# Patient Record
Sex: Female | Born: 1950 | Race: Black or African American | Hispanic: No | Smoking: Former smoker
Health system: Southern US, Community
[De-identification: ages and names within clinical notes are randomized; demographics above are authoritative.]

## PROBLEM LIST (undated history)

## (undated) DIAGNOSIS — I10 Essential (primary) hypertension: Secondary | ICD-10-CM

## (undated) HISTORY — DX: Essential (primary) hypertension: I10

## (undated) HISTORY — PX: TUBAL LIGATION: SHX77

---

## 2020-03-25 ENCOUNTER — Ambulatory Visit
Admission: EM | Admit: 2020-03-25 | Discharge: 2020-03-25 | Disposition: A | Discharge status: Home / Self Care | Payer: Medicare HMO

## 2020-03-25 ENCOUNTER — Encounter: Payer: Self-pay | Admitting: Physician Assistant

## 2020-03-25 DIAGNOSIS — I10 Essential (primary) hypertension: Secondary | ICD-10-CM

## 2020-03-25 MED ORDER — AMLODIPINE BESYLATE 5 MG PO TABS: 5.0000 mg | ORAL_TABLET | Freq: Every day | ORAL | 2 refills | Status: DC

## 2020-03-25 NOTE — Discharge Instructions (Addendum)
Start amlodipine as directed. Keep blood pressure log daily twice a day for PCP to see. Follow up with PCP for further evaluation and management needed. If blood pressure >190, having chest pain, shortness of breath, headache/blurry vision, dizziness, one sided weakness, go to the emergency department for further evaluation needed.

## 2020-03-25 NOTE — ED Triage Notes (Signed)
Pt states she went to the dentist and was advised her blood pressure was elevated. Pt denies any previous hx of hypertension or any other significant medical history. Pt is asymptomatic, aox4, and ambulatory.

## 2020-03-25 NOTE — ED Notes (Signed)
No answer x3 by phone and lobby

## 2020-03-25 NOTE — ED Provider Notes (Signed)
[facility]    CSN: [id number] Arrival date & time: 03/25/20  0912      History   Chief Complaint No chief complaint on file.   HPI Tracy Briggs is a 69 y.o. female.   68 year old female comes in for high blood pressure reading.  She has no significant past medical history, but has not followed with PCP for many years.  States was following up with dentist for dental work, and was told she was hypertensive in the 200s systolic, and was told to come in for evaluation.  She is asymptomatic, denies chest pain, shortness of breath, headache, blurry vision.  Denies weakness, dizziness, syncope.  Denies any pain. No history of HTN.      History reviewed. No pertinent past medical history.  There are no problems to display for this patient.   Past Surgical History:  Procedure Laterality Date  . TUBAL LIGATION      OB History   No obstetric history on file.      Home Medications    Prior to Admission medications   Medication Sig Start Date End Date Taking? Authorizing Provider  Multiple Vitamin (MULTIVITAMIN) tablet Take 1 tablet by mouth daily.   Yes [provider]  amLODipine (NORVASC) 5 MG tablet Take 1 tablet (5 mg total) by mouth daily. 03/25/20   Belinda Fisher, PA-C    Family History History reviewed. No pertinent family history.  Social History Social History   Tobacco Use  . Smoking status: Former Games developer  . Smokeless tobacco: Never Used  Substance Use Topics  . Alcohol use: Yes    Comment: rare  . Drug use: Not on file     Allergies   Codeine   Review of Systems Review of Systems  Reason unable to perform ROS: See HPI as above.     Physical Exam Triage Vital Signs ED Triage Vitals [03/25/20 1210]  Enc Vitals Group     BP (!) 221/108     Pulse Rate 86     Resp 18     Temp 98.1 F (36.7 C)     Temp Source Oral     SpO2 97 %     Weight      Height      Head Circumference      Peak Flow      Pain Score      Pain  Loc      Pain Edu?      Excl. in GC?    No data found.  Updated Vital Signs BP (!) 221/108 (BP Location: Left Arm)   Pulse 86   Temp 98.1 F (36.7 C) (Oral)   Resp 18   SpO2 97%   Physical Exam Constitutional:      General: She is not in acute distress.    Appearance: Normal appearance. She is well-developed. She is not toxic-appearing or diaphoretic.  HENT:     Head: Normocephalic and atraumatic.  Eyes:     Conjunctiva/sclera: Conjunctivae normal.     Pupils: Pupils are equal, round, and reactive to light.  Cardiovascular:     Rate and Rhythm: Normal rate and regular rhythm.  Pulmonary:     Effort: Pulmonary effort is normal. No respiratory distress.     Comments: LCTAB Musculoskeletal:     Cervical back: Normal range of motion and neck supple.  Skin:    General: Skin is warm and dry.  Neurological:     Mental Status: She is  alert and oriented to person, place, and time.     Comments: Grossly intact without focal deficits. Strength 5/5. Sensation intact. Normal gait.       UC Treatments / Results  Labs (all labs ordered are listed, but only abnormal results are displayed) Labs Reviewed - No data to display  EKG   Radiology No results found.  Procedures Procedures (including critical care time)  Medications Ordered in UC Medications - No data to display  Initial Impression / Assessment and Plan / UC Course  I have reviewed the triage vital signs and the nursing notes.  Pertinent labs & imaging results that were available during my care of the patient were reviewed by me and considered in my medical decision making (see chart for details).    Patient at triage BP 221/108, she is asymptomatic without chest pain, shortness of breath, headache, vision changes, dizziness.  Neurology exam grossly intact.  Lungs clear to auscultation bilaterally without adventitious lung sounds.  Start amlodipine as directed.  Patient to keep blood pressure log.  Follow-up with  PCP for further evaluation and management needed.  Return precautions given.  Patient expresses understanding and agrees to plan.  Final Clinical Impressions(s) / UC Diagnoses   Final diagnoses:  Hypertension, unspecified type    ED Prescriptions    Medication Sig Dispense Auth. Provider   amLODipine (NORVASC) 5 MG tablet Take 1 tablet (5 mg total) by mouth daily. 30 tablet Belinda Fisher, PA-C     PDMP not reviewed this encounter.   Belinda Fisher, PA-C 03/25/20 1406

## 2020-04-29 ENCOUNTER — Other Ambulatory Visit: Payer: Self-pay | Admitting: Family Medicine

## 2020-04-29 DIAGNOSIS — Z1231 Encounter for screening mammogram for malignant neoplasm of breast: Secondary | ICD-10-CM

## 2020-05-27 ENCOUNTER — Other Ambulatory Visit: Payer: Self-pay

## 2020-05-27 ENCOUNTER — Ambulatory Visit
Admission: RE | Admit: 2020-05-27 | Discharge: 2020-05-27 | Disposition: A | Discharge status: Home / Self Care | Payer: Medicare HMO | Source: Ambulatory Visit | Attending: Family Medicine | Admitting: Family Medicine

## 2020-05-27 DIAGNOSIS — Z1231 Encounter for screening mammogram for malignant neoplasm of breast: Secondary | ICD-10-CM

## 2020-06-10 ENCOUNTER — Ambulatory Visit (INDEPENDENT_AMBULATORY_CARE_PROVIDER_SITE_OTHER): Payer: Medicare HMO | Admitting: Primary Care

## 2020-06-26 ENCOUNTER — Ambulatory Visit (INDEPENDENT_AMBULATORY_CARE_PROVIDER_SITE_OTHER): Payer: Medicare HMO | Admitting: Physician Assistant

## 2020-06-26 ENCOUNTER — Encounter: Payer: Self-pay | Admitting: Physician Assistant

## 2020-06-26 ENCOUNTER — Other Ambulatory Visit: Payer: Self-pay

## 2020-06-26 VITALS — BP 132/72 | HR 85 | Temp 98.2°F | Resp 18 | Ht 67.0 in | Wt 214.2 lb

## 2020-06-26 DIAGNOSIS — I10 Essential (primary) hypertension: Secondary | ICD-10-CM

## 2020-06-26 MED ORDER — AMLODIPINE BESYLATE 5 MG PO TABS: 5.0000 mg | ORAL_TABLET | Freq: Every day | ORAL | 2 refills | Status: DC

## 2020-06-26 NOTE — Progress Notes (Signed)
Tracy Briggs is a 69 y.o. female here for a new problem.  History of Present Illness:   Chief Complaint  Patient presents with  . New Patient (Initial Visit)    Concerns about her blood pressure. Recent visit to the dentist is when her blood pressure was elevated. Patient didn't fill symptomatic. She's concerned about the current dose of her blood pressure medication. Fasting labs    HPI   HTN Went to the dentist in September and had her BP checked, number was around 200/100. She went to [facility] and was started on Amlodopine 5  mg.   At home blood pressure readings are: extremely variable. Patient denies chest pain, SOB, blurred vision, dizziness, unusual headaches, lower leg swelling. Patient is compliant with medication -- however she did run out of medication about 4 days ago. Denies excessive caffeine intake, stimulant usage, excessive alcohol intake, or increase in salt consumption.  BP Readings from Last 3 Encounters:  06/26/20 132/72  03/25/20 (!) 186/92     Past Medical History:  Diagnosis Date  . Hypertension      Social History   Tobacco Use  . Smoking status: Former Smoker    Types: Cigarettes    Start date: 06/26/1970    Quit date: 06/27/1971    Years since quitting: 49.0  . Smokeless tobacco: Never Used  Substance Use Topics  . Alcohol use: Not Currently    Past Surgical History:  Procedure Laterality Date  . TUBAL LIGATION      Family History  Problem Relation Age of Onset  . Hypertension Mother   . Hypertension Father   . Breast cancer Neg Hx     Allergies  Allergen Reactions  . Codeine Hives    Current Medications:   Current Outpatient Medications:  Marland Kitchen  Multiple Vitamin (MULTIVITAMIN) tablet, Take 1 tablet by mouth daily., Disp: , Rfl:  .  amLODipine (NORVASC) 5 MG tablet, Take 1 tablet (5 mg total) by mouth daily., Disp: 90 tablet, Rfl: 2   Review of Systems:   ROS Negative unless otherwise specified per HPI.  Vitals:    Vitals:   06/26/20 1031  BP: 132/72  Pulse: 85  Resp: 18  Temp: 98.2 F (36.8 C)  TempSrc: Temporal  SpO2: 98%  Weight: 214 lb 3.2 oz (97.2 kg)  Height: 5\' 7"  (1.702 m)     Body mass index is 33.55 kg/m.  Physical Exam:   Physical Exam Vitals and nursing note reviewed.  Constitutional:      General: She is not in acute distress.    Appearance: She is well-developed. She is not ill-appearing, toxic-appearing or sickly-appearing.  Cardiovascular:     Rate and Rhythm: Normal rate and regular rhythm.     Pulses: Normal pulses.     Heart sounds: Normal heart sounds, S1 normal and S2 normal.     Comments: No LE edema Pulmonary:     Effort: Pulmonary effort is normal.     Breath sounds: Normal breath sounds.  Skin:    General: Skin is warm, dry and intact.  Neurological:     Mental Status: She is alert.     GCS: GCS eye subscore is 4. GCS verbal subscore is 5. GCS motor subscore is 6.  Psychiatric:        Mood and Affect: Mood and affect normal.        Speech: Speech normal.        Behavior: Behavior normal. Behavior is cooperative.  No results found for this or any previous visit.  Assessment and Plan:   Loralye was seen today for new patient (initial visit).  Diagnoses and all orders for this visit:  Primary hypertension Normotensive in office today. Restart Norvasc 5 mg daily. Update blood work. Bring BP cuff at next visit to compare. Follow-up in 6 months, sooner if concerns or blood work is abnormal. -     CBC with Differential/Platelet; Future -     Comprehensive metabolic panel; Future -     TSH; Future -     Lipid panel; Future  Other orders -     amLODipine (NORVASC) 5 MG tablet; Take 1 tablet (5 mg total) by mouth daily.  Jarold Motto, PA-C

## 2020-06-26 NOTE — Patient Instructions (Signed)
It was great to see you!  Restart Amlodopine (Norvasc) 5 mg daily.  Update blood work today.  Bring your blood pressure cuff with you next time you come.  Let's follow-up in 6 months, sooner if you have concerns.  Take care,  Jarold Motto PA-C

## 2020-06-27 LAB — CBC WITH DIFFERENTIAL/PLATELET
Absolute Monocytes: 419 cells/uL (ref 200–950)
Basophils Absolute: 41 cells/uL (ref 0–200)
Basophils Relative: 0.7 %
Eosinophils Absolute: 118 cells/uL (ref 15–500)
Eosinophils Relative: 2 %
HCT: 41.7 % (ref 35.0–45.0)
Hemoglobin: 13.8 g/dL (ref 11.7–15.5)
Lymphs Abs: 2466 cells/uL (ref 850–3900)
MCH: 28.2 pg (ref 27.0–33.0)
MCHC: 33.1 g/dL (ref 32.0–36.0)
MCV: 85.1 fL (ref 80.0–100.0)
MPV: 9.6 fL (ref 7.5–12.5)
Monocytes Relative: 7.1 %
Neutro Abs: 2856 cells/uL (ref 1500–7800)
Neutrophils Relative %: 48.4 %
Platelets: 349 10*3/uL (ref 140–400)
RBC: 4.9 10*6/uL (ref 3.80–5.10)
RDW: 13.6 % (ref 11.0–15.0)
Total Lymphocyte: 41.8 %
WBC: 5.9 10*3/uL (ref 3.8–10.8)

## 2020-06-27 LAB — COMPREHENSIVE METABOLIC PANEL
AG Ratio: 1.1 (calc) (ref 1.0–2.5)
ALT: 12 U/L (ref 6–29)
AST: 17 U/L (ref 10–35)
Albumin: 4.1 g/dL (ref 3.6–5.1)
Alkaline phosphatase (APISO): 87 U/L (ref 37–153)
BUN: 8 mg/dL (ref 7–25)
CO2: 29 mmol/L (ref 20–32)
Calcium: 9.6 mg/dL (ref 8.6–10.4)
Chloride: 104 mmol/L (ref 98–110)
Creat: 0.74 mg/dL (ref 0.60–0.93)
Globulin: 3.6 g/dL (calc) (ref 1.9–3.7)
Glucose, Bld: 91 mg/dL (ref 65–99)
Potassium: 4.3 mmol/L (ref 3.5–5.3)
Sodium: 140 mmol/L (ref 135–146)
Total Bilirubin: 0.4 mg/dL (ref 0.2–1.2)
Total Protein: 7.7 g/dL (ref 6.1–8.1)

## 2020-06-27 LAB — LIPID PANEL
Cholesterol: 188 mg/dL (ref <200)
HDL: 48 mg/dL — ABNORMAL LOW (ref >=50)
LDL Cholesterol (Calc): 115 mg/dL (calc) — ABNORMAL HIGH
Non-HDL Cholesterol (Calc): 140 mg/dL (calc) — ABNORMAL HIGH (ref <130)
Total CHOL/HDL Ratio: 3.9 (calc) (ref <5.0)
Triglycerides: 136 mg/dL (ref <150)

## 2020-06-27 LAB — TSH: TSH: 2.98 mIU/L (ref 0.40–4.50)

## 2020-06-28 ENCOUNTER — Other Ambulatory Visit: Payer: Self-pay | Admitting: Physician Assistant

## 2020-07-22 ENCOUNTER — Other Ambulatory Visit: Payer: Self-pay | Admitting: *Deleted

## 2020-07-22 MED ORDER — AMLODIPINE BESYLATE 5 MG PO TABS: 5.0000 mg | ORAL_TABLET | Freq: Every day | ORAL | 0 refills | Status: DC

## 2020-08-13 ENCOUNTER — Other Ambulatory Visit: Payer: Self-pay

## 2020-08-13 ENCOUNTER — Telehealth: Payer: Self-pay

## 2020-08-13 ENCOUNTER — Ambulatory Visit
Admission: EM | Admit: 2020-08-13 | Discharge: 2020-08-13 | Disposition: A | Discharge status: Home / Self Care | Payer: Medicare HMO | Attending: Urgent Care | Admitting: Urgent Care

## 2020-08-13 DIAGNOSIS — I10 Essential (primary) hypertension: Secondary | ICD-10-CM

## 2020-08-13 DIAGNOSIS — R030 Elevated blood-pressure reading, without diagnosis of hypertension: Secondary | ICD-10-CM

## 2020-08-13 MED ORDER — AMLODIPINE BESYLATE 10 MG PO TABS: 10.0000 mg | ORAL_TABLET | Freq: Every day | ORAL | 0 refills | Status: DC

## 2020-08-13 NOTE — ED Provider Notes (Signed)
[facility]   MRN: [id number] DOB: June 16, 1951  Subjective:   Tracy Briggs is a 70 y.o. female presenting for recheck on her blood pressure. She continues to have persistently elevated blood pressure readings. Has been taking amlodipine 5mg . Was checking her bp last year and was in the 130s at home. She does see a PCP now. Had blood work done and everything looked good. Does not eat out. Cooks her own food. It turns out she has consistently been using a salt seasoning for all her cooking.   No current facility-administered medications for this encounter.  Current Outpatient Medications:  .  amLODipine (NORVASC) 5 MG tablet, Take 1 tablet (5 mg total) by mouth daily., Disp: 90 tablet, Rfl: 0 .  Multiple Vitamin (MULTIVITAMIN) tablet, Take 1 tablet by mouth daily., Disp: , Rfl:    Allergies  Allergen Reactions  . Codeine Hives    Past Medical History:  Diagnosis Date  . Hypertension      Past Surgical History:  Procedure Laterality Date  . TUBAL LIGATION      Family History  Problem Relation Age of Onset  . Hypertension Mother   . Hypertension Father   . Breast cancer Neg Hx     Social History   Tobacco Use  . Smoking status: Former Smoker    Types: Cigarettes    Start date: 06/26/1970    Quit date: 06/27/1971    Years since quitting: 49.1  . Smokeless tobacco: Never Used  Vaping Use  . Vaping Use: Never used  Substance Use Topics  . Alcohol use: Not Currently  . Drug use: Not Currently    ROS   Objective:   Vitals: BP (!) 199/80 (BP Location: Left Arm)   Pulse 77   Temp 98.2 F (36.8 C) (Oral)   Resp 17   SpO2 98%   Physical Exam Constitutional:      General: She is not in acute distress.    Appearance: Normal appearance. She is well-developed. She is not ill-appearing, toxic-appearing or diaphoretic.  HENT:     Head: Normocephalic and atraumatic.     Nose: Nose normal.     Mouth/Throat:     Mouth: Mucous membranes are moist.   Eyes:     Extraocular Movements: Extraocular movements intact.     Pupils: Pupils are equal, round, and reactive to light.  Cardiovascular:     Rate and Rhythm: Normal rate and regular rhythm.     Pulses: Normal pulses.     Heart sounds: Normal heart sounds. No murmur heard. No friction rub. No gallop.   Pulmonary:     Effort: Pulmonary effort is normal. No respiratory distress.     Breath sounds: Normal breath sounds. No stridor. No wheezing, rhonchi or rales.  Skin:    General: Skin is warm and dry.     Findings: No rash.  Neurological:     Mental Status: She is alert and oriented to person, place, and time.     Cranial Nerves: No cranial nerve deficit.     Motor: No weakness.     Coordination: Coordination normal.     Gait: Gait normal.     Deep Tendon Reflexes: Reflexes normal.     Comments: Negative Romberg and pronator drift.   Psychiatric:        Mood and Affect: Mood normal.        Behavior: Behavior normal.        Thought Content: Thought content normal.  Assessment and Plan :   PDMP not reviewed this encounter.  1. Essential hypertension   2. Elevated blood pressure reading     Avoid salt all together. Increase amlodipine to 10mg  once daily. Monitoring parameters for her bp reviewed. Follow up with PCP. Counseled patient on potential for adverse effects with medications prescribed/recommended today, ER and return-to-clinic precautions discussed, patient verbalized understanding.    , Wallis Bamberg 08/13/20 [phone]

## 2020-08-13 NOTE — ED Triage Notes (Signed)
Patient states she went to the dentist and was advised her blood pressure was too high for oral surgery. Pt is needing a re-evaluation of her meds.

## 2020-08-13 NOTE — Discharge Instructions (Addendum)

## 2020-08-13 NOTE — Telephone Encounter (Signed)
initial Comment Caller states her blood pressure is 190/91. Translation No Disp. Time Lamount Cohen Time) Disposition Final User 08/13/2020 11:36:14 AM Send To RN Personal Stefano Gaul, RN, Vera 08/13/2020 11:36:52 AM Attempt made - message left Stefano Gaul, RN, Dwana Curd 08/13/2020 11:56:38 AM Attempt made - message left Stefano Gaul, RN, Dwana Curd 08/13/2020 12:11:19 PM FINAL ATTEMPT MADE - message left Yes Stefano Gaul, RN, Ver

## 2020-08-13 NOTE — Telephone Encounter (Signed)
Patient stated she went to get molding done at her dentist office yesterday for an up cominig surgery she is scheduled to have in febuary patients BP was 190/91 - 08/12/20 taken 3 seperate times at her apt. Patient states she is not having an other symptoms along with BP patient states she thinks it was elevated to due her fear of dentists.. but she needs it to be low to get surgery done. Patient was sent to traige but she also wanted to know if she can double her current BP medication or if there is something else she can do to lower BP in order to have surgery  Please Advise?

## 2020-08-13 NOTE — Telephone Encounter (Signed)
Spoke to pt, told her calling about her blood pressure Triage nurse could not get a hold of you. Asked her if she checked her blood pressure today? Pt said no, but she can check now. Told her yes, please. Pt checked Bp is 226/108 and checked again 197/92. Asked pt if she is having any headaches, blurred vision, chest pain or SOB? Pt denied all symptoms.Told pt I would like her to go to an Urgent care to be evaluated. Pt said she will have to check with her friend who drives her if she can take her, if not today she will go tomorrow morning. Told pt okay, but if you develop any chest pain, SOB need to go to the ED. Pt verbalized understanding.

## 2020-08-14 NOTE — Telephone Encounter (Signed)
Pt went to Urgent care yesterday.

## 2020-08-18 ENCOUNTER — Ambulatory Visit: Payer: Medicare HMO | Admitting: Physician Assistant

## 2020-09-08 ENCOUNTER — Other Ambulatory Visit: Payer: Self-pay | Admitting: *Deleted

## 2020-09-08 MED ORDER — AMLODIPINE BESYLATE 10 MG PO TABS: 10.0000 mg | ORAL_TABLET | Freq: Every day | ORAL | 0 refills | Status: DC

## 2020-12-09 ENCOUNTER — Ambulatory Visit: Payer: Medicare HMO | Admitting: Family Medicine

## 2020-12-09 ENCOUNTER — Ambulatory Visit: Payer: Medicare HMO | Admitting: Physician Assistant

## 2020-12-14 IMAGING — MG DIGITAL SCREENING BILAT W/ CAD
4 series · 4 of 4 positions shown · non-contrast
Comparison: None.

CLINICAL DATA: Screening.

EXAM:
DIGITAL SCREENING BILATERAL MAMMOGRAM WITH CAD

[L MLO]
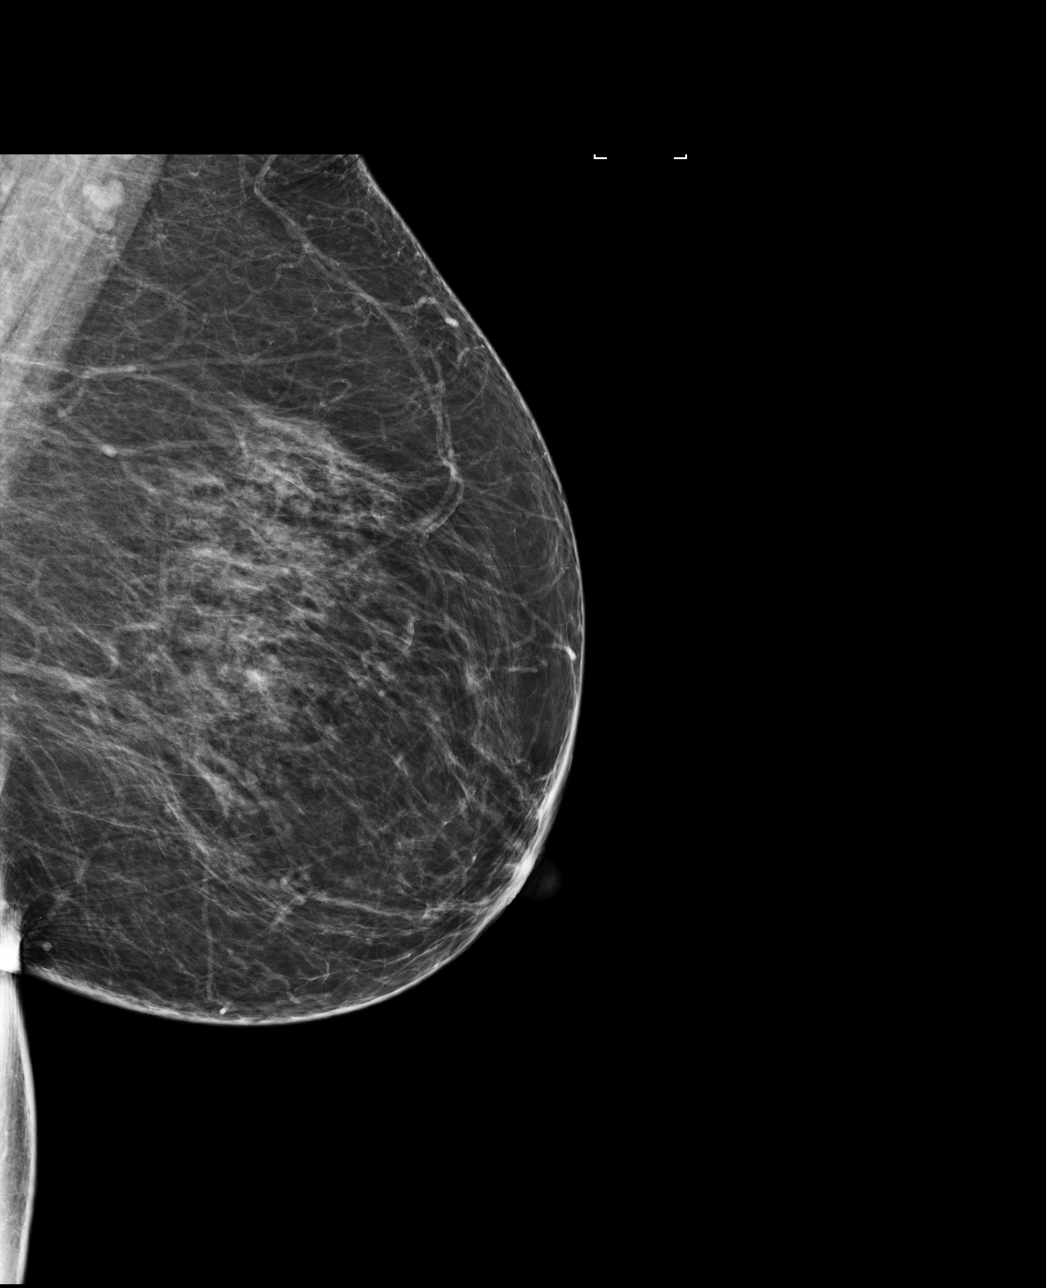

[R CC]
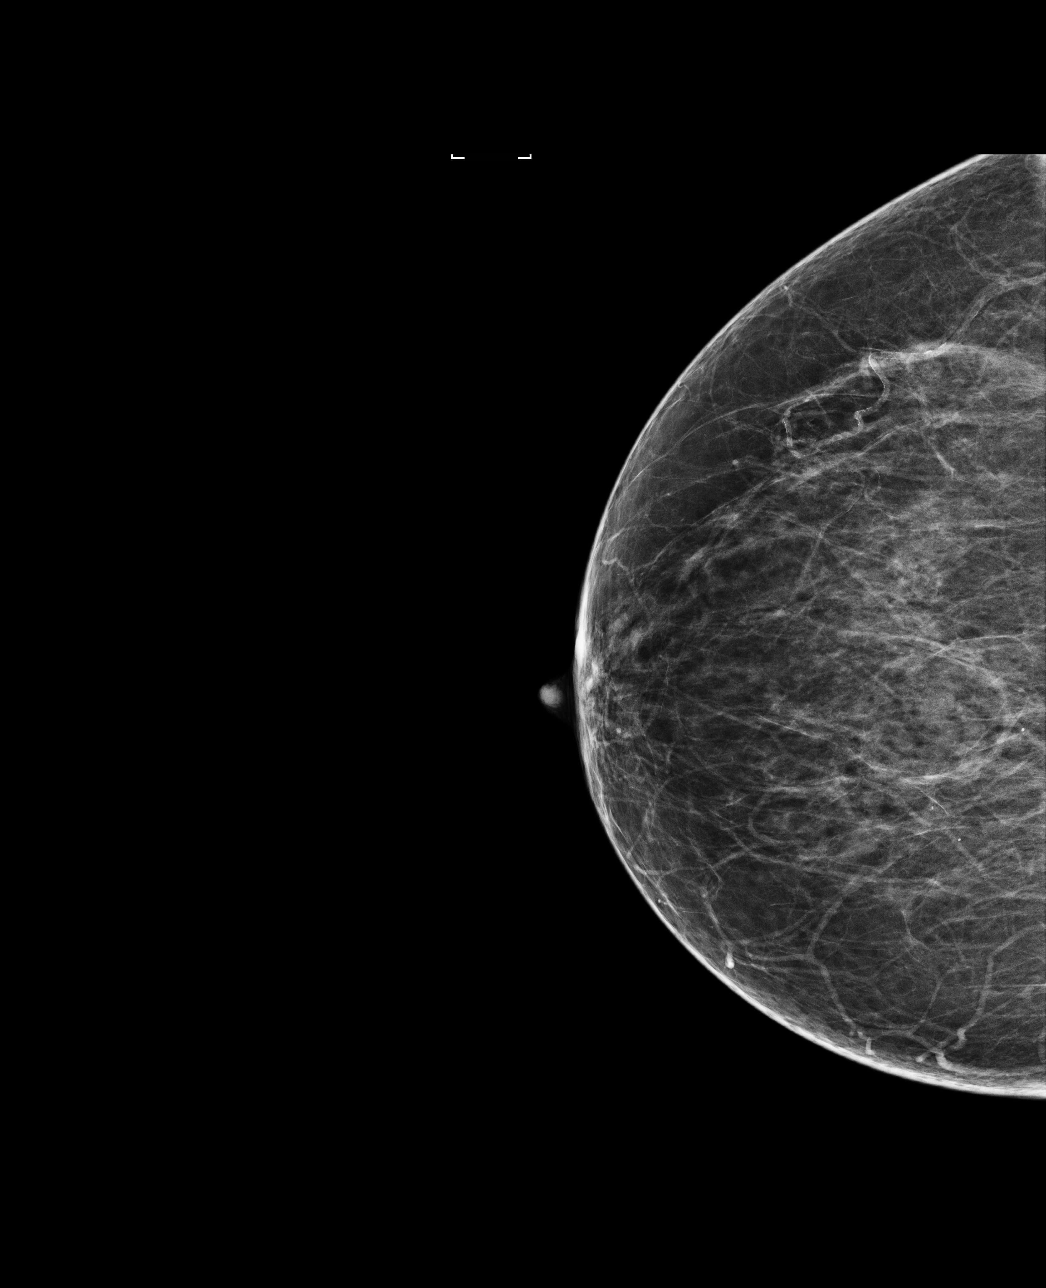

[L CC]
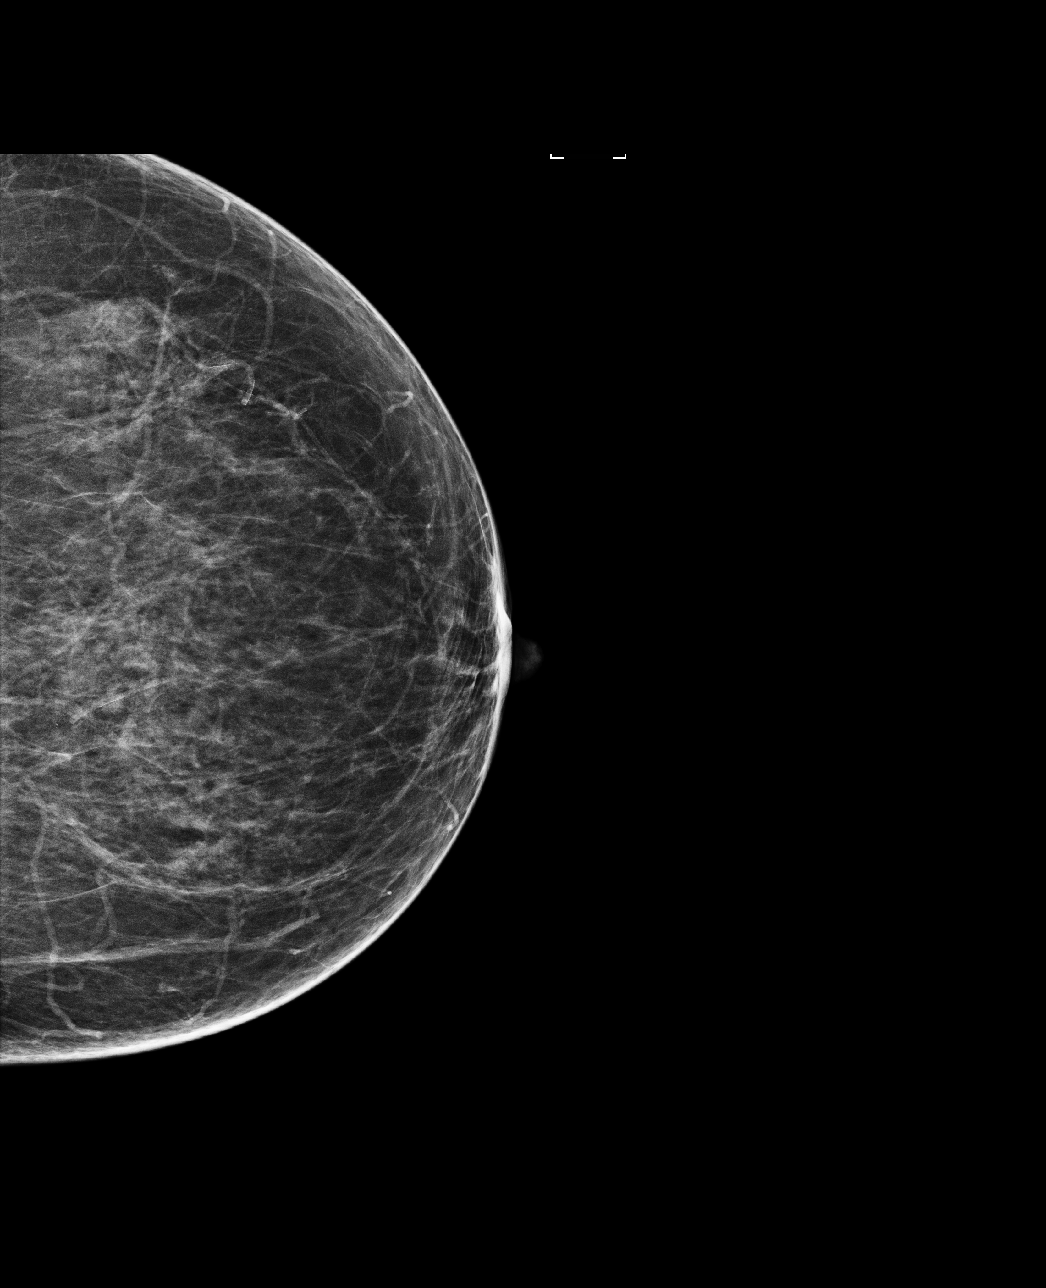

[R MLO]
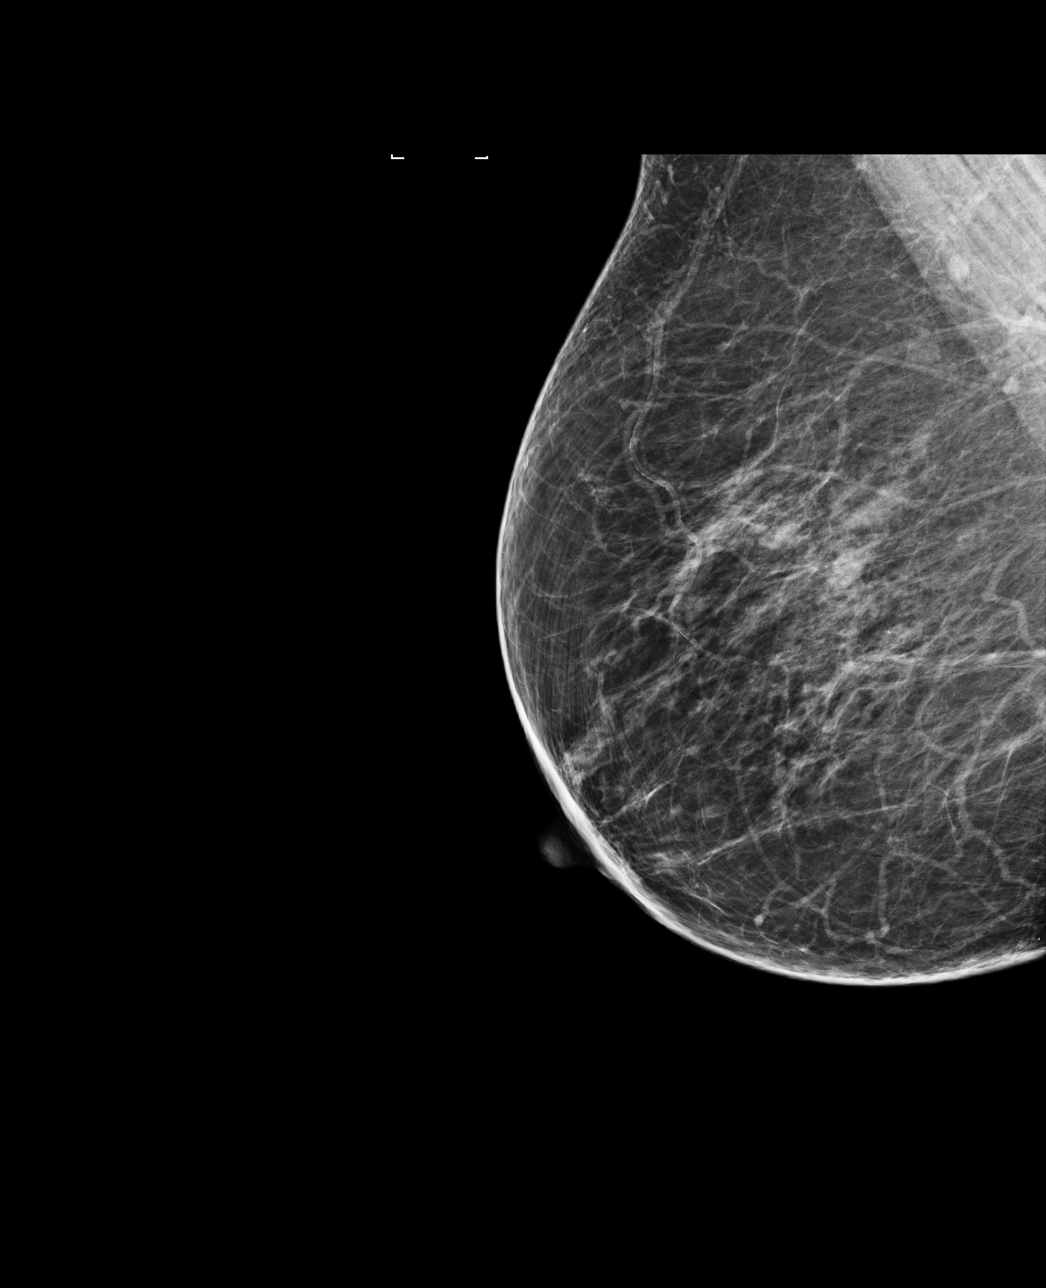

[4 of 4 positions shown; findings below may reference images not displayed]

ACR Breast Density Category b: There are scattered areas of
fibroglandular density.
FINDINGS: There are no findings suspicious for malignancy. Images were
processed with CAD.
IMPRESSION: No mammographic evidence of malignancy. A result letter of this
screening mammogram will be mailed directly to the patient.

RECOMMENDATION:
Screening mammogram in one year. (Code:[id number]-[id number])

BI-RADS CATEGORY  1: Negative.

## 2020-12-15 ENCOUNTER — Ambulatory Visit: Payer: Medicare HMO | Admitting: Physician Assistant

## 2020-12-17 ENCOUNTER — Ambulatory Visit (INDEPENDENT_AMBULATORY_CARE_PROVIDER_SITE_OTHER): Payer: Medicare HMO | Admitting: Family Medicine

## 2020-12-17 ENCOUNTER — Encounter: Payer: Self-pay | Admitting: Family Medicine

## 2020-12-17 ENCOUNTER — Other Ambulatory Visit: Payer: Self-pay

## 2020-12-17 DIAGNOSIS — I10 Essential (primary) hypertension: Secondary | ICD-10-CM | POA: Insufficient documentation

## 2020-12-17 MED ORDER — BACLOFEN 10 MG PO TABS: 10.0000 mg | ORAL_TABLET | Freq: Three times a day (TID) | ORAL | 0 refills | Status: DC

## 2020-12-17 MED ORDER — LOSARTAN POTASSIUM-HCTZ 50-12.5 MG PO TABS: 1.0000 | ORAL_TABLET | Freq: Every day | ORAL | 3 refills | Status: DC

## 2020-12-17 NOTE — Assessment & Plan Note (Signed)
Above goal.  Home readings typically in the 150s.  May be occasionally elevated today in setting of pain.  We will switch amlodipine to losartan-HCTZ 50- 12.5.  She will follow-up here again in a couple of weeks.  Need to recheck BMET at that time.  Discussed reasons to return to care earlier.

## 2020-12-17 NOTE — Progress Notes (Signed)
   Tracy Briggs is a 70 y.o. female who presents today for an office visit.  Assessment/Plan:  New/Acute Problems: Back pain No red flags.  Likely muscular strain.  She can use over-the-counter analgesics as needed.  We will start baclofen today.  She will follow-up with me in a couple of weeks.  Chronic Problems Addressed Today: Essential hypertension Above goal.  Home readings typically in the 150s.  May be occasionally elevated today in setting of pain.  We will switch amlodipine to losartan-HCTZ 50- 12.5.  She will follow-up here again in a couple of weeks.  Need to recheck BMET at that time.  Discussed reasons to return to care earlier.     Subjective:  HPI:  See A/P for status of chronic conditions  She has had some left lower back pain for the last couple of weeks.  Ibuprofen does help.  She has been having some heavy lifting around the house and thinks that she may have flared up then.  Symptoms seem to be improving.  No dysuria.  No hematuria.  No urinary symptoms.       Objective:  Physical Exam: BP (!) 182/81   Pulse 79   Temp (!) 97 F (36.1 C)   Ht 5\' 7"  (1.702 m)   Wt 210 lb 4 oz (95.4 kg)   SpO2 99%   BMI 32.93 kg/m   Gen: No acute distress, resting comfortably CV: Regular rate and rhythm with no murmurs appreciated Pulm: Normal work of breathing, clear to auscultation bilaterally with no crackles, wheezes, or rhonchi MSK: Back without deformities.  Nontender to palpation.  Neurovascular intact distally. Neuro: Grossly normal, moves all extremities Psych: Normal affect and thought content      Asiana Benninger M. , MD 12/17/2020 2:18 PM

## 2020-12-17 NOTE — Patient Instructions (Signed)
It was very nice to see you today!  Please stop the amlodipine and start the new blood pressure medication.  Please try the muscle relaxer for your back.  I will see you back in a couple of weeks.  Please come back to see me sooner if needed.  Take care, Dr Jimmey Ralph  PLEASE NOTE:  If you had any lab tests please let us know if you have not heard back within a few days. You may see your results on mychart before we have a chance to review them but we will give you a call once they are reviewed by [place]. If we ordered any referrals today, please let us know if you have not heard from their office within the next week.   Please try these tips to maintain a healthy lifestyle:   Eat at least 3 REAL meals and 1-2 snacks per day.  Aim for no more than 5 hours between eating.  If you eat breakfast, please do so within one hour of getting up.    Each meal should contain half fruits/vegetables, one quarter protein, and one quarter carbs (no bigger than a computer mouse)   Cut down on sweet beverages. This includes juice, soda, and sweet tea.     Drink at least 1 glass of water with each meal and aim for at least 8 glasses per day   Exercise at least 150 minutes every week.

## 2020-12-21 ENCOUNTER — Telehealth: Payer: Self-pay

## 2020-12-21 NOTE — Telephone Encounter (Signed)
Patient notified to take 1 tablet daily of baclofen

## 2020-12-21 NOTE — Telephone Encounter (Signed)
Can we clarify what medication? If she is taking the baclofen it is ok for her to just take once daily.  Katina Degree. Jimmey Ralph, MD 12/21/2020 12:21 PM

## 2020-12-21 NOTE — Telephone Encounter (Signed)
Patient states she has been taking her pain medication and she needs to lower the dose and only take one a day what do we recommend?

## 2020-12-21 NOTE — Telephone Encounter (Signed)
Dr. Parker, please see message and advise. 

## 2020-12-25 ENCOUNTER — Ambulatory Visit: Payer: Medicare HMO | Admitting: Physician Assistant

## 2020-12-28 ENCOUNTER — Telehealth: Payer: Self-pay

## 2020-12-28 NOTE — Telephone Encounter (Signed)
  LAST APPOINTMENT DATE: 12/17/2020   NEXT APPOINTMENT DATE:@7 /01/2021  MEDICATION:losartan-hydrochlorothiazide (HYZAAR) 50-12.5 MG tablet  PHARMACY:[facility] -Fax:[phone]  Please advise

## 2020-12-29 ENCOUNTER — Other Ambulatory Visit: Payer: Self-pay

## 2020-12-29 MED ORDER — LOSARTAN POTASSIUM-HCTZ 50-12.5 MG PO TABS: 1.0000 | ORAL_TABLET | Freq: Every day | ORAL | 3 refills | Status: DC

## 2020-12-29 NOTE — Telephone Encounter (Signed)
Rx sent in

## 2021-01-21 ENCOUNTER — Ambulatory Visit: Payer: Medicare HMO | Admitting: Family Medicine

## 2021-01-29 ENCOUNTER — Telehealth: Payer: Self-pay | Admitting: Physician Assistant

## 2021-01-29 NOTE — Telephone Encounter (Signed)
Copied from CRM [id number]. Topic: Medicare AWV >> Jan 29, 2021  9:16 AM Harris-Coley, Avon Gully wrote: Reason for CRM: Left message for patient to schedule Annual Wellness Visit.  Please schedule with Nurse Health Advisor Lanier Ensign, RN at [facility].

## 2021-02-08 ENCOUNTER — Ambulatory Visit: Payer: Medicare HMO

## 2021-02-08 ENCOUNTER — Ambulatory Visit: Payer: Medicare HMO | Admitting: Family Medicine

## 2021-02-22 ENCOUNTER — Ambulatory Visit (INDEPENDENT_AMBULATORY_CARE_PROVIDER_SITE_OTHER): Payer: Medicare HMO | Admitting: Physician Assistant

## 2021-02-22 ENCOUNTER — Other Ambulatory Visit: Payer: Self-pay

## 2021-02-22 ENCOUNTER — Encounter: Payer: Self-pay | Admitting: Physician Assistant

## 2021-02-22 VITALS — BP 160/80 | HR 82 | Temp 98.3°F | Ht 67.0 in | Wt 207.0 lb

## 2021-02-22 DIAGNOSIS — R109 Unspecified abdominal pain: Secondary | ICD-10-CM

## 2021-02-22 DIAGNOSIS — I10 Essential (primary) hypertension: Secondary | ICD-10-CM | POA: Diagnosis not present

## 2021-02-22 LAB — URINALYSIS, ROUTINE W REFLEX MICROSCOPIC
Bilirubin Urine: NEGATIVE
Hgb urine dipstick: NEGATIVE
Ketones, ur: NEGATIVE
Leukocytes,Ua: NEGATIVE
Nitrite: NEGATIVE
RBC / HPF: NONE SEEN (ref 0–?)
Specific Gravity, Urine: 1.015 (ref 1.000–1.030)
Total Protein, Urine: NEGATIVE
Urine Glucose: NEGATIVE
Urobilinogen, UA: 0.2 (ref 0.0–1.0)
pH: 5.5 (ref 5.0–8.0)

## 2021-02-22 LAB — BASIC METABOLIC PANEL
BUN: 17 mg/dL (ref 6–23)
CO2: 26 mEq/L (ref 19–32)
Calcium: 9.4 mg/dL (ref 8.4–10.5)
Chloride: 102 mEq/L (ref 96–112)
Creatinine, Ser: 0.92 mg/dL (ref 0.40–1.20)
GFR: 62.67 mL/min (ref >60.00)
Glucose, Bld: 87 mg/dL (ref 70–99)
Potassium: 3.8 mEq/L (ref 3.5–5.1)
Sodium: 137 mEq/L (ref 135–145)

## 2021-02-22 MED ORDER — LOSARTAN POTASSIUM-HCTZ 100-12.5 MG PO TABS: 1.0000 | ORAL_TABLET | Freq: Every day | ORAL | 1 refills | Status: DC

## 2021-02-22 NOTE — Progress Notes (Signed)
Tracy Briggs is a 70 y.o. female is here to follow up on hypertension.  I acted as a Neurosurgeon for [employer], PA-C Corky Mull, LPN   History of Present Illness:   Chief Complaint  Patient presents with   Hypertension    HPI  Hypertension Pt here for f/u on blood pressure. Her medication was changed last visit 6/2 from amlodopine to Losartan-HCTZ 50-12.5 mg one tablet daily. Pt is checking blood pressures at home averaging 150-79. Pt denies headaches, dizziness, blurred vision, chest pain, SOB or lower leg edema. Denies excessive caffeine intake, stimulant usage, excessive alcohol intake or increase in salt consumption.   Abdominal pain She reports ongoing intermittent issues with "twinges" in her LLQ. She states that she saw Dr. Jimmey Briggs for this in June 2022 and was given baclofen. She did not find improvement with this. She denies fever, chills, bowel change, rectal bleeding, nausea, hematuria, back pain, unintentional weight loss.    Health Maintenance Due  Topic Date Due   Hepatitis C Screening  Never done   TETANUS/TDAP  Never done   COLONOSCOPY (Pts 45-62yrs Insurance coverage will need to be confirmed)  Never done   Zoster Vaccines- Shingrix (1 of 2) Never done   DEXA SCAN  Never done   PNA vac Low Risk Adult (1 of 2 - PCV13) Never done   COVID-19 Vaccine (4 - Booster for Moderna series) 10/06/2020   INFLUENZA VACCINE  02/15/2021    Past Medical History:  Diagnosis Date   Hypertension      Social History   Tobacco Use   Smoking status: Former    Types: Cigarettes    Start date: 06/26/1970    Quit date: 06/27/1971    Years since quitting: 49.6   Smokeless tobacco: Never  Vaping Use   Vaping Use: Never used  Substance Use Topics   Alcohol use: Not Currently   Drug use: Not Currently    Past Surgical History:  Procedure Laterality Date   TUBAL LIGATION      Family History  Problem Relation Age of Onset   Hypertension Mother     Hypertension Father    Breast cancer Neg Hx     PMHx, SurgHx, SocialHx, FamHx, Medications, and Allergies were reviewed in the Visit Navigator and updated as appropriate.   Patient Active Problem List   Diagnosis Date Noted   Essential hypertension 12/17/2020    Social History   Tobacco Use   Smoking status: Former    Types: Cigarettes    Start date: 06/26/1970    Quit date: 06/27/1971    Years since quitting: 49.6   Smokeless tobacco: Never  Vaping Use   Vaping Use: Never used  Substance Use Topics   Alcohol use: Not Currently   Drug use: Not Currently    Current Medications and Allergies:    Current Outpatient Medications:    losartan-hydrochlorothiazide (HYZAAR) 100-12.5 MG tablet, Take 1 tablet by mouth daily., Disp: 90 tablet, Rfl: 1   Multiple Vitamin (MULTIVITAMIN) tablet, Take 1 tablet by mouth daily., Disp: , Rfl:    Allergies  Allergen Reactions   Codeine Hives    Review of Systems   ROS Negative unless otherwise specified per HPI.  Vitals:   Vitals:   02/22/21 1029  BP: (!) 160/80  Pulse: 82  Temp: 98.3 F (36.8 C)  TempSrc: Temporal  SpO2: 97%  Weight: 207 lb (93.9 kg)  Height: 5\' 7"  (1.702 m)     Body mass index  is 32.42 kg/m.   Physical Exam:    Physical Exam Vitals and nursing note reviewed.  Constitutional:      General: She is not in acute distress.    Appearance: She is well-developed. She is not ill-appearing or toxic-appearing.  Cardiovascular:     Rate and Rhythm: Normal rate and regular rhythm.     Pulses: Normal pulses.     Heart sounds: Normal heart sounds, S1 normal and S2 normal.     Comments: No LE edema Pulmonary:     Effort: Pulmonary effort is normal.     Breath sounds: Normal breath sounds.  Abdominal:     General: Abdomen is flat.     Palpations: Abdomen is soft.     Tenderness: There is no abdominal tenderness. There is no right CVA tenderness or left CVA tenderness.  Skin:    General: Skin is warm and  dry.  Neurological:     Mental Status: She is alert.     GCS: GCS eye subscore is 4. GCS verbal subscore is 5. GCS motor subscore is 6.  Psychiatric:        Speech: Speech normal.        Behavior: Behavior normal. Behavior is cooperative.     Assessment and Plan:    [name] was seen today for hypertension.  Diagnoses and all orders for this visit:  Essential hypertension Uncontrolled, remains above goal. Increase losartan-hydrochlorothiazide 50-12.5 to losartan-hydrochlorothiazide 100-12.5. Update BMP. Follow-up in 1 month, BP log given. -     Basic metabolic panel -     Urinalysis, Routine w reflex microscopic; Future -     Urine Culture  Flank pain Unclear etiology. No red flags on exam or hx. We will start with UA and urine culture today per her request. Advised on updating health maintenance (colonoscopy, etc), however she is reluctant. Continue to counsel. Consider imaging if persists at next visit. Follow-up in 1 month, sooner if concerns. -     Urinalysis, Routine w reflex microscopic; Future -     Urine Culture  Other orders -     losartan-hydrochlorothiazide (HYZAAR) 100-12.5 MG tablet; Take 1 tablet by mouth daily.   CMA or LPN served as scribe during this visit. History, Physical, and Plan performed by medical provider. The above documentation has been reviewed and is accurate and complete.  Time spent with patient today was 25 minutes which consisted of chart review, discussing diagnosis, work up, treatment answering questions and documentation.   Jarold Motto, PA-C [place] [place] 02/22/2021  Follow-up: Return in about 1 month (around 03/25/2021) for Blood Pressure and Flank Pain F/U.

## 2021-02-22 NOTE — Patient Instructions (Signed)
It was great to see you!  We are increasing your medication from losartan-hydrochlorothiazide 50-12.5 to losartan-hydrochlorothiazide 100-12.5  You can start this new dosage tomorrow  Keep a blood pressure log for me -- try to check 2-3 times per week  I will be in touch with your lab results and urine results  Let's follow-up in 1 month, sooner if you have concerns.  If a referral was placed today, you will be contacted for an appointment. Please note that routine referrals can sometimes take up to 3-4 weeks to process. Please call our office if you haven't heard anything after this time frame.  Take care,  Jarold Motto PA-C

## 2021-02-22 NOTE — Addendum Note (Signed)
Addended by: Lorn Junes on: 02/22/2021 03:20 PM   Modules accepted: Orders

## 2021-02-23 LAB — URINE CULTURE
MICRO NUMBER:: 12213464
Result:: NO GROWTH
SPECIMEN QUALITY:: ADEQUATE

## 2021-02-24 ENCOUNTER — Telehealth: Payer: Self-pay

## 2021-02-24 NOTE — Telephone Encounter (Signed)
See result notes. 

## 2021-02-24 NOTE — Telephone Encounter (Signed)
Patient got notification that there was a message in [place] but unable to see message. Wanting to know if someone can give her a call back.

## 2021-07-22 ENCOUNTER — Telehealth: Payer: Self-pay | Admitting: Physician Assistant

## 2021-07-22 NOTE — Telephone Encounter (Signed)
Did A1C screen and it was a 6.6. [facility] :[phone]- Gertha Calkin NP

## 2021-07-26 ENCOUNTER — Other Ambulatory Visit: Payer: Self-pay | Admitting: Physician Assistant

## 2021-07-26 NOTE — Telephone Encounter (Signed)
Please see message and advise 

## 2021-07-26 NOTE — Telephone Encounter (Signed)
Patient is scheduled for 08/03/21

## 2021-08-03 ENCOUNTER — Other Ambulatory Visit: Payer: Self-pay

## 2021-08-03 ENCOUNTER — Ambulatory Visit (INDEPENDENT_AMBULATORY_CARE_PROVIDER_SITE_OTHER): Payer: Commercial Managed Care - HMO | Admitting: Physician Assistant

## 2021-08-03 ENCOUNTER — Encounter: Payer: Self-pay | Admitting: Physician Assistant

## 2021-08-03 VITALS — BP 140/70 | HR 76 | Temp 98.2°F | Ht 67.0 in | Wt 203.0 lb

## 2021-08-03 DIAGNOSIS — I10 Essential (primary) hypertension: Secondary | ICD-10-CM

## 2021-08-03 DIAGNOSIS — E8881 Metabolic syndrome: Secondary | ICD-10-CM

## 2021-08-03 MED ORDER — LOSARTAN POTASSIUM-HCTZ 100-12.5 MG PO TABS: 1.0000 | ORAL_TABLET | Freq: Every day | ORAL | 3 refills | Status: DC

## 2021-08-03 NOTE — Progress Notes (Signed)
Tracy Briggs is a 71 y.o. female here for a follow up of blood sugar levels.  History of Present Illness:   Chief Complaint  Patient presents with   Abnormal A1c    Pt here to discuss A1c it was 6.6     HPI  Elevated Glucose After having her routine blood screening, Tracy Briggs was found to have a Hgb A1c of 6.6 on 07/22/21. Due to this she wanted to follow up today to discuss options to lower this number. At this time she is not interested in medication intervention, rather wanting to discuss how she can adjust her lifestyle. Pt currently reports that she does try to cook at home often, rarely eating out.   Although she cooks at home, it was found upon further discussion that she is in taking high amounts of sugar in her coffee and a lot of carbohydrates. She does attempt to go to the gym, but does not have anyone to go with her anymore during the colder months. She does feel comfortable walking around her neighborhood during warmer months and has done this a couple of times.  Dietary Recall 10:30am Breakfast: belvita breakfast bar, wheat toast w/ jelly, sausage, coffee w/ sugar, occasional orange juice  4:30pm Dinner: wingettes, cornbread, cabbage, ICE  9pm Dessert: fruit bar  HTN Currently compliant with taking losartan-hydrochlorothiazide 100-12.5 mg daily with no adverse effects.  At home blood pressure readings are: not checked regularly. Patient denies chest pain, SOB, blurred vision, dizziness, unusual headaches, lower leg swelling. Denies excessive caffeine intake, stimulant usage, excessive alcohol intake, or increase in salt consumption.  BP Readings from Last 3 Encounters:  08/03/21 140/70  02/22/21 (!) 160/80  12/17/20 (!) 182/81    Past Medical History:  Diagnosis Date   Hypertension      Social History   Tobacco Use   Smoking status: Former    Types: Cigarettes    Start date: 06/26/1970    Quit date: 06/27/1971    Years since quitting: 50.1   Smokeless  tobacco: Never  Vaping Use   Vaping Use: Never used  Substance Use Topics   Alcohol use: Not Currently   Drug use: Not Currently    Past Surgical History:  Procedure Laterality Date   TUBAL LIGATION      Family History  Problem Relation Age of Onset   Hypertension Mother    Hypertension Father    Breast cancer Neg Hx     Allergies  Allergen Reactions   Codeine Hives    Current Medications:   Current Outpatient Medications:    losartan-hydrochlorothiazide (HYZAAR) 100-12.5 MG tablet, TAKE 1 TABLET BY MOUTH EVERY DAY, Disp: 90 tablet, Rfl: 1   Multiple Vitamin (MULTIVITAMIN) tablet, Take 1 tablet by mouth daily., Disp: , Rfl:    Review of Systems:   ROS Negative unless otherwise specified per HPI. Vitals:   Vitals:   08/03/21 1427  BP: 140/70  Pulse: 76  Temp: 98.2 F (36.8 C)  TempSrc: Temporal  Weight: 203 lb (92.1 kg)  Height: 5\' 7"  (1.702 m)     Body mass index is 31.79 kg/m.  Physical Exam:   Physical Exam Vitals and nursing note reviewed.  Constitutional:      General: She is not in acute distress.    Appearance: She is well-developed. She is not ill-appearing or toxic-appearing.  Cardiovascular:     Rate and Rhythm: Normal rate and regular rhythm.     Pulses: Normal pulses.     Heart  sounds: Normal heart sounds, S1 normal and S2 normal.  Pulmonary:     Effort: Pulmonary effort is normal.     Breath sounds: Normal breath sounds.  Skin:    General: Skin is warm and dry.  Neurological:     Mental Status: She is alert.     GCS: GCS eye subscore is 4. GCS verbal subscore is 5. GCS motor subscore is 6.  Psychiatric:        Speech: Speech normal.        Behavior: Behavior normal. Behavior is cooperative.    Assessment and Plan:   Essential hypertension Controlled  Continue Hyzaar 100-12.5 mg daily  Monitor BP at home regularly, 1-2 times a week I advised patient that if BP reads consistently >140/90, to reach out to office and medication  will be adjusted accordingly  Insulin resistance Uncontrolled Patient declines medication intervention at this time Discussed healthy and balanced eating with patient-- provided handout Recommended walking videos for patient to participate in for increased exercise  Follow up in 4 months to recheck HgbA1c, sooner if concerns   I,Havlyn C Ratchford,acting as a scribe for [employer], PA.,have documented all relevant documentation on the behalf of Jarold Motto, PA,as directed by  Jarold Motto, PA while in the presence of Jarold Motto, [place].  I, Jarold Motto, [place], have reviewed all documentation for this visit. The documentation on 08/03/21 for the exam, diagnosis, procedures, and orders are all accurate and complete.   Jarold Motto, PA-C

## 2021-08-03 NOTE — Patient Instructions (Addendum)
It was great to see you!  Look at Walking Videos on YouTube -- search "exercise walking at home"  Only choose one at breakfast: belvita bar OR toast  Try to choose a dessert with protein like a yogurt bar  Any juice serving should be 4 oz OR LESS  Follow-up with me in 4 months to recheck your HgbA1c  You can do this!  Take care,  Jarold Motto PA-C

## 2021-08-18 ENCOUNTER — Encounter: Payer: Self-pay | Admitting: Physician Assistant

## 2021-08-18 LAB — POCT GLYCOSYLATED HEMOGLOBIN (HGB A1C): Hemoglobin A1C: 6.6 % — AB (ref 4.0–5.6)

## 2021-09-27 ENCOUNTER — Ambulatory Visit (INDEPENDENT_AMBULATORY_CARE_PROVIDER_SITE_OTHER): Payer: Medicare Other

## 2021-09-27 ENCOUNTER — Other Ambulatory Visit: Payer: Self-pay

## 2021-09-27 DIAGNOSIS — Z1211 Encounter for screening for malignant neoplasm of colon: Secondary | ICD-10-CM

## 2021-09-27 DIAGNOSIS — Z0000 Encounter for general adult medical examination without abnormal findings: Secondary | ICD-10-CM | POA: Diagnosis not present

## 2021-09-27 NOTE — Progress Notes (Addendum)
Virtual Visit via Telephone Note  I connected with  Tracy Briggs on 09/27/21 at  1:45 PM EDT by telephone and verified that I am speaking with the correct person using two identifiers.  Medicare Annual Wellness visit completed telephonically due to Covid-19 pandemic.   Persons participating in this call: This Health Coach and this patient.   Location: Patient: home Provider: office    I discussed the limitations, risks, security and privacy concerns of performing an evaluation and management service by telephone and the availability of in person appointments. The patient expressed understanding and agreed to proceed.  Unable to perform video visit due to video visit attempted and failed and/or patient does not have video capability.   Some vital signs may be absent or patient reported.   Tracy Schlein, LPN   Subjective:   Tracy Briggs is a 70 y.o. female who presents for Medicare Annual (Subsequent) preventive examination.  Review of Systems     Cardiac Risk Factors include: advanced age (>69men, >44 women);hypertension     Objective:    There were no vitals filed for this visit. There is no height or weight on file to calculate BMI.  Advanced Directives 09/27/2021  Does Patient Have a Medical Advance Directive? No  Would patient like information on creating a medical advance directive? No - Patient declined    Current Medications (verified) Outpatient Encounter Medications as of 09/27/2021  Medication Sig   losartan-hydrochlorothiazide (HYZAAR) 100-12.5 MG tablet Take 1 tablet by mouth daily.   Multiple Vitamin (MULTIVITAMIN) tablet Take 1 tablet by mouth daily.   No facility-administered encounter medications on file as of 09/27/2021.    Allergies (verified) Codeine   History: Past Medical History:  Diagnosis Date   Hypertension    Past Surgical History:  Procedure Laterality Date   TUBAL LIGATION     Family History  Problem Relation Age of Onset    Hypertension Mother    Hypertension Father    Breast cancer Neg Hx    Social History   Socioeconomic History   Marital status: Widowed    Spouse name: Not on file   Number of children: Not on file   Years of education: Not on file   Highest education level: Not on file  Occupational History   Not on file  Tobacco Use   Smoking status: Former    Types: Cigarettes    Start date: 06/26/1970    Quit date: 06/27/1971    Years since quitting: 50.2   Smokeless tobacco: Never  Vaping Use   Vaping Use: Never used  Substance and Sexual Activity   Alcohol use: Not Currently   Drug use: Not Currently   Sexual activity: Not Currently  Other Topics Concern   Not on file  Social History Narrative   From [place]   Lives alone   Family all in [place]   Was in [place], 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   Social Determinants of Health   Financial Resource Strain: Low Risk    Difficulty of Paying Living Expenses: Not hard at all  Food Insecurity: No Food Insecurity   Worried About Programme researcher, broadcasting/film/video in the Last Year: Never true   Barista in the Last Year: Never true  Transportation Needs: No Transportation Needs   Lack of Transportation (Medical): No   Lack of Transportation (Non-Medical): No  Physical Activity: Inactive   Days of Exercise per Week: 0 days   Minutes of Exercise per Session: 0  min  Stress: No Stress Concern Present   Feeling of Stress : Not at all  Social Connections: Socially Isolated   Frequency of Communication with Friends and Family: More than three times a week   Frequency of Social Gatherings with Friends and Family: Twice a week   Attends Religious Services: Never   Database administrator or Organizations: No   Attends Banker Meetings: Never   Marital Status: Widowed    Tobacco Counseling Counseling given: Not Answered   Clinical Intake:  Pre-visit preparation completed: Yes  Pain : No/denies pain     BMI - recorded:  31.79 Nutritional Status: BMI > 30  Obese Nutritional Risks: None Diabetes: No  How often do you need to have someone help you when you read instructions, pamphlets, or other written materials from your doctor or pharmacy?: 1 - Never  Diabetic?no   Interpreter Needed?: No  Information entered by :: Lanier Ensign, LPN   Activities of Daily Living In your present state of health, do you have any difficulty performing the following activities: 09/27/2021  Hearing? N  Vision? N  Difficulty concentrating or making decisions? N  Walking or climbing stairs? N  Dressing or bathing? N  Doing errands, shopping? N  Preparing Food and eating ? N  Using the Toilet? N  In the past six months, have you accidently leaked urine? N  Do you have problems with loss of bowel control? N  Managing your Medications? N  Managing your Finances? N  Housekeeping or managing your Housekeeping? N  Some recent data might be hidden    Patient Care Team: Jarold Motto, [place] as PCP - General (Physician Assistant)  Indicate any recent Medical Services you may have received from other than [facility] providers in the past year (date may be approximate).     Assessment:   This is a routine wellness examination for Tracy Briggs.  Hearing/Vision screen Hearing Screening - Comments:: Pt denies any hearing issues  Vision Screening - Comments:: Pt follows up with Dr Nedra Hai for annual eye exams   Dietary issues and exercise activities discussed: Current Exercise Habits: The patient does not participate in regular exercise at present (walks around house)   Goals Addressed             This Visit's Progress    Patient Stated       Get some pounds off        Depression Screen PHQ 2/9 Scores 09/27/2021 08/03/2021 06/26/2020  PHQ - 2 Score 0 0 0    Fall Risk Fall Risk  09/27/2021  Falls in the past year? 0  Number falls in past yr: 0  Injury with Fall? 0  Risk for fall due to : Impaired vision  Follow up Falls  prevention discussed    FALL RISK PREVENTION PERTAINING TO THE HOME:  Any stairs in or around the home? No  If so, are there any without handrails? No  Home free of loose throw rugs in walkways, pet beds, electrical cords, etc? Yes  Adequate lighting in your home to reduce risk of falls? Yes   ASSISTIVE DEVICES UTILIZED TO PREVENT FALLS:  Life alert? No  Use of a cane, walker or w/c? No  Grab bars in the bathroom? Yes  Shower chair or bench in shower? No  Elevated toilet seat or a handicapped toilet? No   TIMED UP AND GO:  Was the test performed? No .   Cognitive Function:  6CIT Screen 09/27/2021  What Year? 0 points  What month? 0 points  What time? 0 points  Count back from 20 0 points  Months in reverse 2 points  Repeat phrase 2 points  Total Score 4    Immunizations Immunization History  Administered Date(s) Administered   Moderna Sars-Covid-2 Vaccination 11/12/2019, 12/10/2019, 06/08/2020    TDAP status: Due, Education has been provided regarding the importance of this vaccine. Advised may receive this vaccine at local pharmacy or Health Dept. Aware to provide a copy of the vaccination record if obtained from local pharmacy or Health Dept. Verbalized acceptance and understanding.  Flu Vaccine status: Due, Education has been provided regarding the importance of this vaccine. Advised may receive this vaccine at local pharmacy or Health Dept. Aware to provide a copy of the vaccination record if obtained from local pharmacy or Health Dept. Verbalized acceptance and understanding.  Pneumococcal vaccine status: Due, Education has been provided regarding the importance of this vaccine. Advised may receive this vaccine at local pharmacy or Health Dept. Aware to provide a copy of the vaccination record if obtained from local pharmacy or Health Dept. Verbalized acceptance and understanding.  Covid-19 vaccine status: Completed vaccines  Qualifies for Shingles Vaccine? No    Zostavax completed No     Screening Tests Health Maintenance  Topic Date Due   Hepatitis C Screening  Never done   COLONOSCOPY (Pts 45-62yrs Insurance coverage will need to be confirmed)  Never done   DEXA SCAN  Never done   COVID-19 Vaccine (4 - Booster for Moderna series) 08/03/2020   TETANUS/TDAP  02/22/2022 (Originally 09/12/1969)   MAMMOGRAM  05/27/2022   HPV VACCINES  Aged Out   Pneumonia Vaccine 8+ Years old  Discontinued   INFLUENZA VACCINE  Discontinued   Zoster Vaccines- Shingrix  Discontinued    Health Maintenance  Health Maintenance Due  Topic Date Due   Hepatitis C Screening  Never done   COLONOSCOPY (Pts 45-6yrs Insurance coverage will need to be confirmed)  Never done   DEXA SCAN  Never done   COVID-19 Vaccine (4 - Booster for Moderna series) 08/03/2020    Colorectal cancer screening: Referral to GI placed 09/27/21. Pt aware the office will call re: appt.  Mammogram status: Completed 05/27/20. Repeat every year      Additional Screening:  Hepatitis C Screening: does qualify;  Vision Screening: Recommended annual ophthalmology exams for early detection of glaucoma and other disorders of the eye. Is the patient up to date with their annual eye exam?  Yes  Who is the provider or what is the name of the office in which the patient attends annual eye exams? Dr Nedra Hai If pt is not established with a provider, would they like to be referred to a provider to establish care? No .   Dental Screening: Recommended annual dental exams for proper oral hygiene  Community Resource Referral / Chronic Care Management: CRR required this visit?  No   CCM required this visit?  No      Plan:     I have personally reviewed and noted the following in the patients chart:   Medical and social history Use of alcohol, tobacco or illicit drugs  Current medications and supplements including opioid prescriptions.  Functional ability and status Nutritional  status Physical activity Advanced directives List of other physicians Hospitalizations, surgeries, and ER visits in previous 12 months Vitals Screenings to include cognitive, depression, and falls Referrals and appointments  In addition, I have  reviewed and discussed with patient certain preventive protocols, quality metrics, and best practice recommendations. A written personalized care plan for preventive services as well as general preventive health recommendations were provided to patient.     Tracy Schleinina H Bird Tailor, LPN   0/98/11913/13/2023   Nurse Notes: None

## 2021-09-27 NOTE — Patient Instructions (Addendum)
[name] , Thank you for taking time to come for your Medicare Wellness Visit. I appreciate your ongoing commitment to your health goals. Please review the following plan we discussed and let me know if I can assist you in the future.   Screening recommendations/referrals: Colonoscopy: order placed for cologuard 09/27/21 Mammogram: Done 05/27/20 repeat every 2 years  Recommended yearly ophthalmology/optometry visit for glaucoma screening and checkup Recommended yearly dental visit for hygiene and checkup  Vaccinations: Influenza vaccine: declined  Pneumococcal vaccine: Declined  Tdap vaccine: declined and discussed  Shingles vaccine: Shingrix discussed. Please contact your pharmacy for coverage information.    Covid-19:Completed 4/27, 5/25, & 06/08/20  Advanced directives: Advance directive discussed with you today. Even though you declined this today please call our office should you change your mind and we can give you the proper paperwork for you to fill out.   Conditions/risks identified: Lose a few lbs  Next appointment: Follow up in one year for your annual wellness visit    Preventive Care 65 Years and Older, Female Preventive care refers to lifestyle choices and visits with your health care provider that can promote health and wellness. What does preventive care include? A yearly physical exam. This is also called an annual well check. Dental exams once or twice a year. Routine eye exams. Ask your health care provider how often you should have your eyes checked. Personal lifestyle choices, including: Daily care of your teeth and gums. Regular physical activity. Eating a healthy diet. Avoiding tobacco and drug use. Limiting alcohol use. Practicing safe sex. Taking low-dose aspirin every day. Taking vitamin and mineral supplements as recommended by your health care provider. What happens during an annual well check? The services and screenings done by your health care  provider during your annual well check will depend on your age, overall health, lifestyle risk factors, and family history of disease. Counseling  Your health care provider may ask you questions about your: Alcohol use. Tobacco use. Drug use. Emotional well-being. Home and relationship well-being. Sexual activity. Eating habits. History of falls. Memory and ability to understand (cognition). Work and work Astronomer. Reproductive health. Screening  You may have the following tests or measurements: Height, weight, and BMI. Blood pressure. Lipid and cholesterol levels. These may be checked every 5 years, or more frequently if you are over 55 years old. Skin check. Lung cancer screening. You may have this screening every year starting at age 72 if you have a 30-pack-year history of smoking and currently smoke or have quit within the past 15 years. Fecal occult blood test (FOBT) of the stool. You may have this test every year starting at age 72. Flexible sigmoidoscopy or colonoscopy. You may have a sigmoidoscopy every 5 years or a colonoscopy every 10 years starting at age 72. Hepatitis C blood test. Hepatitis B blood test. Sexually transmitted disease (STD) testing. Diabetes screening. This is done by checking your blood sugar (glucose) after you have not eaten for a while (fasting). You may have this done every 1-3 years. Bone density scan. This is done to screen for osteoporosis. You may have this done starting at age 72. Mammogram. This may be done every 1-2 years. Talk to your health care provider about how often you should have regular mammograms. Talk with your health care provider about your test results, treatment options, and if necessary, the need for more tests. Vaccines  Your health care provider may recommend certain vaccines, such as: Influenza vaccine. This is recommended every  year. Tetanus, diphtheria, and acellular pertussis (Tdap, Td) vaccine. You may need a Td  booster every 10 years. Zoster vaccine. You may need this after age 25. Pneumococcal 13-valent conjugate (PCV13) vaccine. One dose is recommended after age 72. Pneumococcal polysaccharide (PPSV23) vaccine. One dose is recommended after age 72. Talk to your health care provider about which screenings and vaccines you need and how often you need them. This information is not intended to replace advice given to you by your health care provider. Make sure you discuss any questions you have with your health care provider. Document Released: 07/31/2015 Document Revised: 03/23/2016 Document Reviewed: 05/05/2015 Elsevier Interactive Patient Education  2017 [place] Prevention in the Home Falls can cause injuries. They can happen to people of all ages. There are many things you can do to make your home safe and to help prevent falls. What can I do on the outside of my home? Regularly fix the edges of walkways and driveways and fix any cracks. Remove anything that might make you trip as you walk through a door, such as a raised step or threshold. Trim any bushes or trees on the path to your home. Use bright outdoor lighting. Clear any walking paths of anything that might make someone trip, such as rocks or tools. Regularly check to see if handrails are loose or broken. Make sure that both sides of any steps have handrails. Any raised decks and porches should have guardrails on the edges. Have any leaves, snow, or ice cleared regularly. Use sand or salt on walking paths during winter. Clean up any spills in your garage right away. This includes oil or grease spills. What can I do in the bathroom? Use night lights. Install grab bars by the toilet and in the tub and shower. Do not use towel bars as grab bars. Use non-skid mats or decals in the tub or shower. If you need to sit down in the shower, use a plastic, non-slip stool. Keep the floor dry. Clean up any water that spills on the floor  as soon as it happens. Remove soap buildup in the tub or shower regularly. Attach bath mats securely with double-sided non-slip rug tape. Do not have throw rugs and other things on the floor that can make you trip. What can I do in the bedroom? Use night lights. Make sure that you have a light by your bed that is easy to reach. Do not use any sheets or blankets that are too big for your bed. They should not hang down onto the floor. Have a firm chair that has side arms. You can use this for support while you get dressed. Do not have throw rugs and other things on the floor that can make you trip. What can I do in the kitchen? Clean up any spills right away. Avoid walking on wet floors. Keep items that you use a lot in easy-to-reach places. If you need to reach something above you, use a strong step stool that has a grab bar. Keep electrical cords out of the way. Do not use floor polish or wax that makes floors slippery. If you must use wax, use non-skid floor wax. Do not have throw rugs and other things on the floor that can make you trip. What can I do with my stairs? Do not leave any items on the stairs. Make sure that there are handrails on both sides of the stairs and use them. Fix handrails that are broken or  loose. Make sure that handrails are as long as the stairways. Check any carpeting to make sure that it is firmly attached to the stairs. Fix any carpet that is loose or worn. Avoid having throw rugs at the top or bottom of the stairs. If you do have throw rugs, attach them to the floor with carpet tape. Make sure that you have a light switch at the top of the stairs and the bottom of the stairs. If you do not have them, ask someone to add them for you. What else can I do to help prevent falls? Wear shoes that: Do not have high heels. Have rubber bottoms. Are comfortable and fit you well. Are closed at the toe. Do not wear sandals. If you use a stepladder: Make sure that it is  fully opened. Do not climb a closed stepladder. Make sure that both sides of the stepladder are locked into place. Ask someone to hold it for you, if possible. Clearly mark and make sure that you can see: Any grab bars or handrails. First and last steps. Where the edge of each step is. Use tools that help you move around (mobility aids) if they are needed. These include: Canes. Walkers. Scooters. Crutches. Turn on the lights when you go into a dark area. Replace any light bulbs as soon as they burn out. Set up your furniture so you have a clear path. Avoid moving your furniture around. If any of your floors are uneven, fix them. If there are any pets around you, be aware of where they are. Review your medicines with your doctor. Some medicines can make you feel dizzy. This can increase your chance of falling. Ask your doctor what other things that you can do to help prevent falls. This information is not intended to replace advice given to you by your health care provider. Make sure you discuss any questions you have with your health care provider. Document Released: 04/30/2009 Document Revised: 12/10/2015 Document Reviewed: 08/08/2014 Elsevier Interactive Patient Education  2017 [employer].

## 2021-10-19 LAB — COLOGUARD: COLOGUARD: NEGATIVE

## 2021-12-07 ENCOUNTER — Ambulatory Visit: Payer: Commercial Managed Care - HMO | Admitting: Physician Assistant

## 2021-12-09 ENCOUNTER — Ambulatory Visit: Payer: Commercial Managed Care - HMO | Admitting: Physician Assistant

## 2021-12-28 ENCOUNTER — Ambulatory Visit: Payer: Commercial Managed Care - HMO | Admitting: Physician Assistant

## 2022-01-03 ENCOUNTER — Encounter: Payer: Self-pay | Admitting: Physician Assistant

## 2022-01-03 ENCOUNTER — Ambulatory Visit (INDEPENDENT_AMBULATORY_CARE_PROVIDER_SITE_OTHER): Payer: Medicare Other | Admitting: Physician Assistant

## 2022-01-03 VITALS — BP 140/78 | HR 68 | Temp 97.9°F | Ht 67.0 in | Wt 204.2 lb

## 2022-01-03 DIAGNOSIS — E785 Hyperlipidemia, unspecified: Secondary | ICD-10-CM | POA: Diagnosis not present

## 2022-01-03 DIAGNOSIS — E8881 Metabolic syndrome: Secondary | ICD-10-CM | POA: Diagnosis not present

## 2022-01-03 DIAGNOSIS — I10 Essential (primary) hypertension: Secondary | ICD-10-CM

## 2022-01-03 LAB — LIPID PANEL
Cholesterol: 189 mg/dL (ref 0–200)
HDL: 50.7 mg/dL (ref >39.00)
LDL Cholesterol: 119 mg/dL — ABNORMAL HIGH (ref 0–99)
NonHDL: 138.6
Total CHOL/HDL Ratio: 4
Triglycerides: 98 mg/dL (ref 0.0–149.0)
VLDL: 19.6 mg/dL (ref 0.0–40.0)

## 2022-01-03 LAB — CBC WITH DIFFERENTIAL/PLATELET
Basophils Absolute: 0 10*3/uL (ref 0.0–0.1)
Basophils Relative: 0.5 % (ref 0.0–3.0)
Eosinophils Absolute: 0.1 10*3/uL (ref 0.0–0.7)
Eosinophils Relative: 1.2 % (ref 0.0–5.0)
HCT: 39.8 % (ref 36.0–46.0)
Hemoglobin: 12.8 g/dL (ref 12.0–15.0)
Lymphocytes Relative: 41.3 % (ref 12.0–46.0)
Lymphs Abs: 2.1 10*3/uL (ref 0.7–4.0)
MCHC: 32.2 g/dL (ref 30.0–36.0)
MCV: 86.7 fl (ref 78.0–100.0)
Monocytes Absolute: 0.3 10*3/uL (ref 0.1–1.0)
Monocytes Relative: 5.5 % (ref 3.0–12.0)
Neutro Abs: 2.7 10*3/uL (ref 1.4–7.7)
Neutrophils Relative %: 51.5 % (ref 43.0–77.0)
Platelets: 306 10*3/uL (ref 150.0–400.0)
RBC: 4.59 Mil/uL (ref 3.87–5.11)
RDW: 14.2 % (ref 11.5–15.5)
WBC: 5.2 10*3/uL (ref 4.0–10.5)

## 2022-01-03 LAB — COMPREHENSIVE METABOLIC PANEL
ALT: 13 U/L (ref 0–35)
AST: 19 U/L (ref 0–37)
Albumin: 4 g/dL (ref 3.5–5.2)
Alkaline Phosphatase: 72 U/L (ref 39–117)
BUN: 12 mg/dL (ref 6–23)
CO2: 29 mEq/L (ref 19–32)
Calcium: 9.9 mg/dL (ref 8.4–10.5)
Chloride: 106 mEq/L (ref 96–112)
Creatinine, Ser: 0.91 mg/dL (ref 0.40–1.20)
GFR: 63.12 mL/min (ref >60.00)
Glucose, Bld: 97 mg/dL (ref 70–99)
Potassium: 4.9 mEq/L (ref 3.5–5.1)
Sodium: 142 mEq/L (ref 135–145)
Total Bilirubin: 0.4 mg/dL (ref 0.2–1.2)
Total Protein: 7.7 g/dL (ref 6.0–8.3)

## 2022-01-03 LAB — HEMOGLOBIN A1C: Hgb A1c MFr Bld: 5.7 % (ref 4.6–6.5)

## 2022-01-03 NOTE — Patient Instructions (Signed)
It was great to see you!  We will update your blood work today and I will be in touch.  Take care,  Jarold Motto PA-C

## 2022-01-03 NOTE — Progress Notes (Signed)
Tracy Briggs is a 71 y.o. female here for a follow up of hypertension.   History of Present Illness:   Chief Complaint  Patient presents with   Hypertension    Pt here for f/u, Bp this morning was 137/75.    HPI  HTN Currently taking Hyzaar 100-12.5 daily with no adverse side effects. At home blood pressure readings are: checked regularly. States her blood pressure was 137/75 this morning. States she has been trying to walk and follow healthy diet. Patient denies chest pain, SOB, blurred vision, dizziness, unusual headaches, lower leg swelling. Patient is  compliant with medication. Denies excessive caffeine intake, stimulant usage, excessive alcohol intake, or increase in salt consumption.  BP Readings from Last 3 Encounters:  01/03/22 140/78  08/03/21 140/70  02/22/21 (!) 160/80   Insulin resistance  Patient had work-up on 07/22/2021. She was found to have elevated blood sugar. States has been trying to cut down on sugar drinks. She is trying to work on diet and exercise. She is currently walking in her neighborhood. She would like to update her blood work today.  Past Medical History:  Diagnosis Date   Hypertension      Social History   Tobacco Use   Smoking status: Former    Types: Cigarettes    Start date: 06/26/1970    Quit date: 06/27/1971    Years since quitting: 50.5   Smokeless tobacco: Never  Vaping Use   Vaping Use: Never used  Substance Use Topics   Alcohol use: Not Currently   Drug use: Not Currently    Past Surgical History:  Procedure Laterality Date   TUBAL LIGATION      Family History  Problem Relation Age of Onset   Hypertension Mother    Hypertension Father    Breast cancer Neg Hx     Allergies  Allergen Reactions   Codeine Hives    Current Medications:   Current Outpatient Medications:    losartan-hydrochlorothiazide (HYZAAR) 100-12.5 MG tablet, Take 1 tablet by mouth daily., Disp: 90 tablet, Rfl: 3   Multiple Vitamin  (MULTIVITAMIN) tablet, Take 1 tablet by mouth daily., Disp: , Rfl:    Review of Systems:   ROS Negative unless otherwise specified per HPI.   Vitals:   Vitals:   01/03/22 1102  BP: 140/78  Pulse: 68  Temp: 97.9 F (36.6 C)  TempSrc: Temporal  Weight: 204 lb 4 oz (92.6 kg)  Height: 5\' 7"  (1.702 m)     Body mass index is 31.99 kg/m.  Physical Exam:   Physical Exam Vitals and nursing note reviewed.  Constitutional:      General: She is not in acute distress.    Appearance: She is well-developed. She is not ill-appearing or toxic-appearing.  Cardiovascular:     Rate and Rhythm: Normal rate and regular rhythm.     Pulses: Normal pulses.     Heart sounds: Normal heart sounds, S1 normal and S2 normal.  Pulmonary:     Effort: Pulmonary effort is normal.     Breath sounds: Normal breath sounds.  Skin:    General: Skin is warm and dry.  Neurological:     Mental Status: She is alert.     GCS: GCS eye subscore is 4. GCS verbal subscore is 5. GCS motor subscore is 6.  Psychiatric:        Speech: Speech normal.        Behavior: Behavior normal. Behavior is cooperative.     Assessment and  Plan:   Insulin resistance Update A1c She is very reluctant to trial medication due to side effects Will provide recommendations as indicated Discussed need for more consistent exercise and sugar reduction Follow-up in 3-6 months, based on level of control  Essential hypertension Normotensive Continue losartan-hydrochlorothiazide 100-12.5 mg Follow-up in 6 months sooner if concerns  Hyperlipidemia, unspecified hyperlipidemia type Update lipid panel and provide recommendations accordingly   I,Savera Zaman,acting as a scribe for [employer], PA.,have documented all relevant documentation on the behalf of Jarold Motto, PA,as directed by  Jarold Motto, PA while in the presence of Jarold Motto, [place].   I, Jarold Motto, [place], have reviewed all documentation for this visit.  The documentation on 01/03/22 for the exam, diagnosis, procedures, and orders are all accurate and complete.   Jarold Motto, PA-C

## 2022-07-04 ENCOUNTER — Ambulatory Visit: Payer: Medicare Other | Admitting: Physician Assistant

## 2022-07-20 ENCOUNTER — Ambulatory Visit: Payer: Medicare Other | Admitting: Physician Assistant

## 2022-07-29 ENCOUNTER — Ambulatory Visit: Payer: Medicare Other | Admitting: Physician Assistant

## 2022-08-02 ENCOUNTER — Encounter: Payer: Self-pay | Admitting: Physician Assistant

## 2022-08-02 ENCOUNTER — Ambulatory Visit (INDEPENDENT_AMBULATORY_CARE_PROVIDER_SITE_OTHER): Payer: 59 | Admitting: Physician Assistant

## 2022-08-02 VITALS — BP 126/74 | HR 79 | Temp 97.8°F | Ht 67.0 in | Wt 200.0 lb

## 2022-08-02 DIAGNOSIS — E785 Hyperlipidemia, unspecified: Secondary | ICD-10-CM

## 2022-08-02 DIAGNOSIS — I10 Essential (primary) hypertension: Secondary | ICD-10-CM | POA: Diagnosis not present

## 2022-08-02 NOTE — Progress Notes (Signed)
Tracy Briggs is a 72 y.o. female here for a follow up of a pre-existing problem.  History of Present Illness:   Chief Complaint  Patient presents with   Hypertension    Pt has been checking blood pressure at home, averaging 130/70.   Insulin resistance    Hypertension    HTN Currently taking losartan-hctz 100-12.5 mg. At home blood pressure readings are: on average 130/70. Patient denies chest pain, SOB, blurred vision, dizziness, unusual headaches, lower leg swelling. Patient is compliant with medication. Denies excessive caffeine intake, stimulant usage, excessive alcohol intake, or increase in salt consumption.  BP Readings from Last 3 Encounters:  08/02/22 126/74  01/03/22 140/78  08/03/21 140/70   Limits red meats and pork.   HLD She currently does not take cholesterol medication. She does not want to take cholesterol medication.    Past Medical History:  Diagnosis Date   Hypertension      Social History   Tobacco Use   Smoking status: Former    Types: Cigarettes    Start date: 06/26/1970    Quit date: 06/27/1971    Years since quitting: 51.1   Smokeless tobacco: Never  Vaping Use   Vaping Use: Never used  Substance Use Topics   Alcohol use: Not Currently   Drug use: Not Currently    Past Surgical History:  Procedure Laterality Date   TUBAL LIGATION      Family History  Problem Relation Age of Onset   Hypertension Mother    Hypertension Father    Breast cancer Neg Hx     Allergies  Allergen Reactions   Codeine Hives    Current Medications:   Current Outpatient Medications:    losartan-hydrochlorothiazide (HYZAAR) 100-12.5 MG tablet, Take 1 tablet by mouth daily., Disp: 90 tablet, Rfl: 3   Multiple Vitamin (MULTIVITAMIN) tablet, Take 1 tablet by mouth daily., Disp: , Rfl:    Review of Systems:   ROS Negative unless otherwise specified per HPI.  Vitals:   Vitals:   08/02/22 1321  BP: 126/74  Pulse: 79  Temp: 97.8 F (36.6  C)  TempSrc: Temporal  SpO2: 99%  Weight: 200 lb (90.7 kg)  Height: 5\' 7"  (1.702 m)     Body mass index is 31.32 kg/m.  Physical Exam:   Physical Exam Vitals and nursing note reviewed.  Constitutional:      General: She is not in acute distress.    Appearance: She is well-developed. She is not ill-appearing or toxic-appearing.  Cardiovascular:     Rate and Rhythm: Normal rate and regular rhythm.     Pulses: Normal pulses.     Heart sounds: Normal heart sounds, S1 normal and S2 normal.  Pulmonary:     Effort: Pulmonary effort is normal.     Breath sounds: Normal breath sounds.  Skin:    General: Skin is warm and dry.  Neurological:     Mental Status: She is alert.     GCS: GCS eye subscore is 4. GCS verbal subscore is 5. GCS motor subscore is 6.  Psychiatric:        Speech: Speech normal.        Behavior: Behavior normal. Behavior is cooperative.     Assessment and Plan:   Essential hypertension Normotensive Continue losartan-HCTZ 100-12.5 mg daily Follow-up in 6 months, sooner if concerns  Hyperlipidemia, unspecified hyperlipidemia type She declines medication at this time Recommended that she start OTC psyllium for cholesterol We will recheck lipid panel in  6 months   Inda Coke, [place]

## 2022-08-02 NOTE — Patient Instructions (Signed)
It was great to see you!  Keep up the good work  [employer] usually sends me your blood work so I will take a look at that when I receive it  Work on adding some psyllium in for fiber and cholesterol reduction  Let's follow-up in 6 months for a physical.  Take care,  Inda Coke PA-C

## 2022-08-15 ENCOUNTER — Telehealth: Payer: Self-pay | Admitting: Physician Assistant

## 2022-08-15 DIAGNOSIS — R9389 Abnormal findings on diagnostic imaging of other specified body structures: Secondary | ICD-10-CM

## 2022-08-15 NOTE — Telephone Encounter (Signed)
Caller states: -She completed a PVD screening on patient - Results were left leg, mild and right leg, moderate. - [employer] will be sending over a report of everything she completed during house call in depth   For additional questions she can be reached at [phone]; SECURED LINE.

## 2022-08-16 NOTE — Telephone Encounter (Signed)
FYI, see message. I have not received report yet.

## 2022-08-18 NOTE — Addendum Note (Signed)
Addended by: Marian Sorrow on: 08/18/2022 02:52 PM   Modules accepted: Orders

## 2022-08-18 NOTE — Telephone Encounter (Signed)
Spoke to pt told her we received a message from your insurance about the House call they did and Aldona Bar asked if she would like referral to [facility] to evaluate her abnormal ultrasound results. Pt verbalized understanding and said yes, that would be fine. Told her okay I will place the referral and someone will contact you to schedule an appt. Pt verbalized understanding.

## 2022-09-05 ENCOUNTER — Other Ambulatory Visit: Payer: Self-pay | Admitting: *Deleted

## 2022-09-05 DIAGNOSIS — R0989 Other specified symptoms and signs involving the circulatory and respiratory systems: Secondary | ICD-10-CM

## 2022-09-19 ENCOUNTER — Ambulatory Visit (HOSPITAL_COMMUNITY)
Admission: RE | Admit: 2022-09-19 | Discharge: 2022-09-19 | Disposition: A | Discharge status: Home / Self Care | Payer: 59 | Source: Ambulatory Visit | Attending: Surgery | Admitting: Surgery

## 2022-09-19 ENCOUNTER — Ambulatory Visit (INDEPENDENT_AMBULATORY_CARE_PROVIDER_SITE_OTHER): Payer: 59 | Admitting: Physician Assistant

## 2022-09-19 VITALS — BP 172/83 | HR 80 | Temp 98.0°F | Resp 20 | Ht 67.0 in | Wt 201.7 lb

## 2022-09-19 DIAGNOSIS — R0989 Other specified symptoms and signs involving the circulatory and respiratory systems: Secondary | ICD-10-CM | POA: Insufficient documentation

## 2022-09-19 DIAGNOSIS — Z026 Encounter for examination for insurance purposes: Secondary | ICD-10-CM

## 2022-09-19 LAB — VAS US ABI WITH/WO TBI
Left ABI: 1.08
Right ABI: 1.03

## 2022-09-21 NOTE — Progress Notes (Signed)
Office Note   History of Present Illness   Tracy Briggs is a 72 y.o. (June 05, 1951) female who presents for evaluation of abnormal ABIs on home insurance screening. She has no previous history of arterial disease, CVA/TIA, or MI. She denies ever requiring any vascular interventions to improve her blood flow.  She denies any claudication, rest pain, or lower extremity wounds. She denies any history of DVT.  She has no family history of AAA.  Current Outpatient Medications  Medication Sig Dispense Refill   losartan-hydrochlorothiazide (HYZAAR) 100-12.5 MG tablet Take 1 tablet by mouth daily. 90 tablet 3   Multiple Vitamin (MULTIVITAMIN) tablet Take 1 tablet by mouth daily.     No current facility-administered medications for this visit.    REVIEW OF SYSTEMS (negative unless checked):   Cardiac:  '[]'$  Chest pain or chest pressure? '[]'$  Shortness of breath upon activity? '[]'$  Shortness of breath when lying flat? '[]'$  Irregular heart rhythm?  Vascular:  '[]'$  Pain in calf, thigh, or hip brought on by walking? '[]'$  Pain in feet at night that wakes you up from your sleep? '[]'$  Blood clot in your veins? '[]'$  Leg swelling?  Pulmonary:  '[]'$  Oxygen at home? '[]'$  Productive cough? '[]'$  Wheezing?  Neurologic:  '[]'$  Sudden weakness in arms or legs? '[]'$  Sudden numbness in arms or legs? '[]'$  Sudden onset of difficult speaking or slurred speech? '[]'$  Temporary loss of vision in one eye? '[]'$  Problems with dizziness?  Gastrointestinal:  '[]'$  Blood in stool? '[]'$  Vomited blood?  Genitourinary:  '[]'$  Burning when urinating? '[]'$  Blood in urine?  Psychiatric:  '[]'$  Major depression  Hematologic:  '[]'$  Bleeding problems? '[]'$  Problems with blood clotting?  Dermatologic:  '[]'$  Rashes or ulcers?  Constitutional:  '[]'$  Fever or chills?  Ear/Nose/Throat:  '[]'$  Change in hearing? '[]'$  Nose bleeds? '[]'$  Sore throat?  Musculoskeletal:  '[]'$  Back pain? '[]'$  Joint pain? '[]'$  Muscle pain?   Physical Examination   Vitals:    09/19/22 1350  BP: (!) 172/83  Pulse: 80  Resp: 20  Temp: 98 F (36.7 C)  TempSrc: Temporal  SpO2: 98%  Weight: 201 lb 11.2 oz (91.5 kg)  Height: '5\' 7"'$  (1.702 m)   Body mass index is 31.59 kg/m.  General:  WDWN in NAD; vital signs documented above Gait: Not observed HENT: WNL, normocephalic Pulmonary: normal non-labored breathing  Cardiac: regular rate and rhythm Abdomen: soft, NT, no masses Skin: without rashes Vascular Exam/Pulses: Palpable DP pulses bilaterally Extremities: without ischemic changes, without gangrene , without cellulitis; without open wounds;  Musculoskeletal: no muscle wasting or atrophy  Neurologic: A&O X 3;  No focal weakness or paresthesias are detected Psychiatric:  The pt has Normal affect.  Non-Invasive Vascular imaging   ABI (09/19/2022) +-------+-----------+-----------+------------+------------+  ABI/TBIToday's ABIToday's TBIPrevious ABIPrevious TBI  +-------+-----------+-----------+------------+------------+  Right 1.03       0.47       0.52                      +-------+-----------+-----------+------------+------------+  Left  1.08       0.7        0.83                      +-------+-----------+-----------+------------+------------+   Medical Decision Making   Tracy Briggs is a 72 y.o. female who presents for abnormal ABIs found on insurance screening  Based on the patient's vascular studies, her ABIs are normal in both lower extremities. She has a slightly  decreased TBI on the right She has palpable pedal pulses. She denies any arterial symptoms such as claudication, rest pain, or non healing wounds I do not think she has any evidence of PAD. She can follow up with our office as needed.  Vicente Serene PA-C [facility] Office: [facility] MD: Trula Slade

## 2022-09-22 ENCOUNTER — Telehealth: Payer: Self-pay

## 2022-09-22 NOTE — Telephone Encounter (Signed)
Contacted Tracy Briggs to schedule their annual wellness visit. Appointment made for 09/29/22.  [name], [place] ([facility])  [place] [facility] [phone]

## 2022-09-29 ENCOUNTER — Ambulatory Visit (INDEPENDENT_AMBULATORY_CARE_PROVIDER_SITE_OTHER): Payer: 59

## 2022-09-29 VITALS — Wt 201.0 lb

## 2022-09-29 DIAGNOSIS — Z1231 Encounter for screening mammogram for malignant neoplasm of breast: Secondary | ICD-10-CM | POA: Diagnosis not present

## 2022-09-29 DIAGNOSIS — Z0000 Encounter for general adult medical examination without abnormal findings: Secondary | ICD-10-CM

## 2022-09-29 NOTE — Patient Instructions (Addendum)
Tracy Briggs , Thank you for taking time to come for your Medicare Wellness Visit. I appreciate your ongoing commitment to your health goals. Please review the following plan we discussed and let me know if I can assist you in the future.   These are the goals we discussed:  Goals      Patient Stated     Get some pounds off         This is a list of the screening recommended for you and due dates:  Health Maintenance  Topic Date Due   Hepatitis C Screening: USPSTF Recommendation to screen - Ages 72-79 yo.  Never done   DTaP/Tdap/Td vaccine (1 - Tdap) Never done   DEXA scan (bone density measurement)  Never done   Mammogram  05/27/2022   Medicare Annual Wellness Visit  09/28/2022   Pneumonia Vaccine (1 of 1 - PCV) 08/03/2023*   Flu Shot  08/03/2023*   COVID-19 Vaccine (4 - 2023-24 season) 08/03/2023*   Zoster (Shingles) Vaccine (1 of 2) 08/03/2023*   Cologuard (Stool DNA test)  10/10/2024   HPV Vaccine  Aged Out  *Topic was postponed. The date shown is not the original due date.    Advanced directives: Advance directive discussed with you today. Even though you declined this today please call our office should you change your mind and we can give you the proper paperwork for you to fill out.   Conditions/risks identified: lose weight   Next appointment: Follow up in one year for your annual wellness visit    Preventive Care 65 Years and Older, Female Preventive care refers to lifestyle choices and visits with your health care provider that can promote health and wellness. What does preventive care include? A yearly physical exam. This is also called an annual well check. Dental exams once or twice a year. Routine eye exams. Ask your health care provider how often you should have your eyes checked. Personal lifestyle choices, including: Daily care of your teeth and gums. Regular physical activity. Eating a healthy diet. Avoiding tobacco and drug use. Limiting alcohol  use. Practicing safe sex. Taking low-dose aspirin every day. Taking vitamin and mineral supplements as recommended by your health care provider. What happens during an annual well check? The services and screenings done by your health care provider during your annual well check will depend on your age, overall health, lifestyle risk factors, and family history of disease. Counseling  Your health care provider may ask you questions about your: Alcohol use. Tobacco use. Drug use. Emotional well-being. Home and relationship well-being. Sexual activity. Eating habits. History of falls. Memory and ability to understand (cognition). Work and work Statistician. Reproductive health. Screening  You may have the following tests or measurements: Height, weight, and BMI. Blood pressure. Lipid and cholesterol levels. These may be checked every 5 years, or more frequently if you are over 21 years old. Skin check. Lung cancer screening. You may have this screening every year starting at age 14 if you have a 30-pack-year history of smoking and currently smoke or have quit within the past 15 years. Fecal occult blood test (FOBT) of the stool. You may have this test every year starting at age 66. Flexible sigmoidoscopy or colonoscopy. You may have a sigmoidoscopy every 5 years or a colonoscopy every 10 years starting at age 58. Hepatitis C blood test. Hepatitis B blood test. Sexually transmitted disease (STD) testing. Diabetes screening. This is done by checking your blood sugar (glucose) after you  have not eaten for a while (fasting). You may have this done every 1-3 years. Bone density scan. This is done to screen for osteoporosis. You may have this done starting at age 36. Mammogram. This may be done every 1-2 years. Talk to your health care provider about how often you should have regular mammograms. Talk with your health care provider about your test results, treatment options, and if necessary,  the need for more tests. Vaccines  Your health care provider may recommend certain vaccines, such as: Influenza vaccine. This is recommended every year. Tetanus, diphtheria, and acellular pertussis (Tdap, Td) vaccine. You may need a Td booster every 10 years. Zoster vaccine. You may need this after age 85. Pneumococcal 13-valent conjugate (PCV13) vaccine. One dose is recommended after age 31. Pneumococcal polysaccharide (PPSV23) vaccine. One dose is recommended after age 66. Talk to your health care provider about which screenings and vaccines you need and how often you need them. This information is not intended to replace advice given to you by your health care provider. Make sure you discuss any questions you have with your health care provider. Document Released: 07/31/2015 Document Revised: 03/23/2016 Document Reviewed: 05/05/2015 Elsevier Interactive Patient Education  2017 [employer] Prevention in the Home Falls can cause injuries. They can happen to people of all ages. There are many things you can do to make your home safe and to help prevent falls. What can I do on the outside of my home? Regularly fix the edges of walkways and driveways and fix any cracks. Remove anything that might make you trip as you walk through a door, such as a raised step or threshold. Trim any bushes or trees on the path to your home. Use bright outdoor lighting. Clear any walking paths of anything that might make someone trip, such as rocks or tools. Regularly check to see if handrails are loose or broken. Make sure that both sides of any steps have handrails. Any raised decks and porches should have guardrails on the edges. Have any leaves, snow, or ice cleared regularly. Use sand or salt on walking paths during winter. Clean up any spills in your garage right away. This includes oil or grease spills. What can I do in the bathroom? Use night lights. Install grab bars by the toilet and in the  tub and shower. Do not use towel bars as grab bars. Use non-skid mats or decals in the tub or shower. If you need to sit down in the shower, use a plastic, non-slip stool. Keep the floor dry. Clean up any water that spills on the floor as soon as it happens. Remove soap buildup in the tub or shower regularly. Attach bath mats securely with double-sided non-slip rug tape. Do not have throw rugs and other things on the floor that can make you trip. What can I do in the bedroom? Use night lights. Make sure that you have a light by your bed that is easy to reach. Do not use any sheets or blankets that are too big for your bed. They should not hang down onto the floor. Have a firm chair that has side arms. You can use this for support while you get dressed. Do not have throw rugs and other things on the floor that can make you trip. What can I do in the kitchen? Clean up any spills right away. Avoid walking on wet floors. Keep items that you use a lot in easy-to-reach places. If you need  to reach something above you, use a strong step stool that has a grab bar. Keep electrical cords out of the way. Do not use floor polish or wax that makes floors slippery. If you must use wax, use non-skid floor wax. Do not have throw rugs and other things on the floor that can make you trip. What can I do with my stairs? Do not leave any items on the stairs. Make sure that there are handrails on both sides of the stairs and use them. Fix handrails that are broken or loose. Make sure that handrails are as long as the stairways. Check any carpeting to make sure that it is firmly attached to the stairs. Fix any carpet that is loose or worn. Avoid having throw rugs at the top or bottom of the stairs. If you do have throw rugs, attach them to the floor with carpet tape. Make sure that you have a light switch at the top of the stairs and the bottom of the stairs. If you do not have them, ask someone to add them for  you. What else can I do to help prevent falls? Wear shoes that: Do not have high heels. Have rubber bottoms. Are comfortable and fit you well. Are closed at the toe. Do not wear sandals. If you use a stepladder: Make sure that it is fully opened. Do not climb a closed stepladder. Make sure that both sides of the stepladder are locked into place. Ask someone to hold it for you, if possible. Clearly mark and make sure that you can see: Any grab bars or handrails. First and last steps. Where the edge of each step is. Use tools that help you move around (mobility aids) if they are needed. These include: Canes Walkers. Scooters. Crutches. Turn on the lights when you go into a dark area. Replace any light bulbs as soon as they burn out. Set up your furniture so you have a clear path. Avoid moving your furniture around. If any of your floors are uneven, fix them. If there are any pets around you, be aware of where they are. Review your medicines with your doctor. Some medicines can make you feel dizzy. This can increase your chance of falling. Ask your doctor what other things that you can do to help prevent falls. This information is not intended to replace advice given to you by your health care provider. Make sure you discuss any questions you have with your health care provider. Document Released: 04/30/2009 Document Revised: 12/10/2015 Document Reviewed: 08/08/2014 Elsevier Interactive Patient Education  2017 [employer]. Chartered certified accountant Patient Education  [employer].

## 2022-09-29 NOTE — Progress Notes (Addendum)
I connected with  Tracy Briggs on 09/29/22 by a audio enabled telemedicine application and verified that I am speaking with the correct person using two identifiers.  Patient Location: Home  Provider Location: Office/Clinic  I discussed the limitations of evaluation and management by telemedicine. The patient expressed understanding and agreed to proceed.   Subjective:   Tracy Briggs is a 72 y.o. female who presents for Medicare Annual (Subsequent) preventive examination.  Review of Systems     Cardiac Risk Factors include: advanced age (>40mn, >>2women);hypertension;obesity (BMI >30kg/m2)     Objective:    Today's Vitals   09/29/22 1409  Weight: 201 lb (91.2 kg)   Body mass index is 31.48 kg/m.     09/29/2022    2:12 PM 09/27/2021    1:45 PM  Advanced Directives  Does Patient Have a Medical Advance Directive? No No  Would patient like information on creating a medical advance directive? No - Patient declined No - Patient declined    Current Medications (verified) Outpatient Encounter Medications as of 09/29/2022  Medication Sig   losartan-hydrochlorothiazide (HYZAAR) 100-12.5 MG tablet Take 1 tablet by mouth daily.   Multiple Vitamin (MULTIVITAMIN) tablet Take 1 tablet by mouth daily.   No facility-administered encounter medications on file as of 09/29/2022.    Allergies (verified) Codeine   History: Past Medical History:  Diagnosis Date   Hypertension    Past Surgical History:  Procedure Laterality Date   TUBAL LIGATION     Family History  Problem Relation Age of Onset   Hypertension Mother    Hypertension Father    Breast cancer Neg Hx    Social History   Socioeconomic History   Marital status: Widowed    Spouse name: Not on file   Number of children: Not on file   Years of education: Not on file   Highest education level: Not on file  Occupational History   Not on file  Tobacco Use   Smoking status: Former    Types: Cigarettes     Start date: 06/26/1970    Quit date: 06/27/1971    Years since quitting: 51.2    Passive exposure: Never   Smokeless tobacco: Never  Vaping Use   Vaping Use: Never used  Substance and Sexual Activity   Alcohol use: Not Currently   Drug use: Not Currently   Sexual activity: Not Currently  Other Topics Concern   Not on file  Social History Narrative   [place]  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   Social Determinants of Health   Financial Resource Strain: Low Risk  (09/25/2022)   Overall Financial Resource Strain (CARDIA)    Difficulty of Paying Living Expenses: Not hard at all  Food Insecurity: No Food Insecurity (09/25/2022)   Hunger Vital Sign    Worried About Running Out of Food in the Last Year: Never true    [place] Never true  Transportation Needs: No Transportation Needs (09/25/2022)   PRAPARE - THydrologist(Medical): No    Lack of Transportation (Non-Medical): No  Physical Activity: Inactive (09/25/2022)   Exercise Vital Sign    Days of Exercise per Week: 0 days    Minutes of Exercise per Session: 0 min  Stress: No Stress Concern Present (09/25/2022)   [place]  Feeling of Stress : Not at all  Social Connections: Socially Isolated (09/25/2022)   Social Connection and Isolation Panel [NHANES]    Frequency of Communication with Friends and Family: More than three times a week    Frequency of Social Gatherings with Friends and Family: Three times a week    Attends Religious Services: Never    Active Member of Clubs or Organizations: No    Attends Archivist Meetings: Never    Marital Status: Widowed    Tobacco Counseling Counseling given: Not Answered   Clinical Intake:  Pre-visit preparation completed: Yes  Pain : No/denies pain     BMI - recorded: 31.48 Nutritional  Status: BMI > 30  Obese Nutritional Risks: None Diabetes: No  How often do you need to have someone help you when you read instructions, pamphlets, or other written materials from your doctor or pharmacy?: 1 - Never  Diabetic?no  Interpreter Needed?: No  Information entered by :: Charlott Rakes, LPN   Activities of Daily Living    09/25/2022    9:35 AM  In your present state of health, do you have any difficulty performing the following activities:  Hearing? 0  Vision? 0  Difficulty concentrating or making decisions? 0  Walking or climbing stairs? 0  Dressing or bathing? 0  Doing errands, shopping? 0  Preparing Food and eating ? N  Using the Toilet? N  In the past six months, have you accidently leaked urine? N  Do you have problems with loss of bowel control? N  Managing your Medications? N  Managing your Finances? N  Housekeeping or managing your Housekeeping? N    Patient Care Team: Inda Coke, [place] as PCP - General (Physician Assistant)  Indicate any recent Medical Services you may have received from other than [facility] providers in the past year (date may be approximate).     Assessment:   This is a routine wellness examination for Tracy Briggs.  Hearing/Vision screen Hearing Screening - Comments:: Pt denies any hearing issues  Vision Screening - Comments:: Pt follows up with Dr Truman Hayward for annual eye exams   Dietary issues and exercise activities discussed: Current Exercise Habits: The patient does not participate in regular exercise at present   Goals Addressed             This Visit's Progress    Patient Stated       Lose weight        Depression Screen    09/29/2022    2:11 PM 09/27/2021    1:43 PM 08/03/2021    2:31 PM 06/26/2020   10:29 AM  PHQ 2/9 Scores  PHQ - 2 Score 0 0 0 0  PHQ- 9 Score 0       Fall Risk    09/25/2022    9:35 AM 08/02/2022    1:21 PM 09/27/2021    1:46 PM  [place] in the past year? 0 0 0  Number falls in past  yr: 0 0 0  Injury with Fall? 0 0 0  Risk for fall due to : Impaired vision  Impaired vision  Follow up Falls prevention discussed Falls evaluation completed Falls prevention discussed    FALL RISK PREVENTION PERTAINING TO THE HOME:  Any stairs in or around the home? No  If so, are there any without handrails? No  Home free of loose throw rugs in walkways, pet beds, electrical cords, etc? Yes  Adequate lighting in your home  to reduce risk of falls? Yes   ASSISTIVE DEVICES UTILIZED TO PREVENT FALLS:  Life alert? No  Use of a cane, walker or w/c? No  Grab bars in the bathroom? Yes  Shower chair or bench in shower? No  Elevated toilet seat or a handicapped toilet? No   TIMED UP AND GO:  Was the test performed? No .  Cognitive Function:        09/29/2022    2:13 PM 09/27/2021    1:47 PM  6CIT Screen  What Year? 0 points 0 points  What month? 0 points 0 points  What time? 0 points 0 points  Count back from 20 0 points 0 points  Months in reverse 0 points 2 points  Repeat phrase 0 points 2 points  Total Score 0 points 4 points    Immunizations Immunization History  Administered Date(s) Administered   Moderna Sars-Covid-2 Vaccination 11/12/2019, 12/10/2019, 06/08/2020    TDAP status: Due, Education has been provided regarding the importance of this vaccine. Advised may receive this vaccine at local pharmacy or Health Dept. Aware to provide a copy of the vaccination record if obtained from local pharmacy or Health Dept. Verbalized acceptance and understanding.  Flu Vaccine status: Declined, Education has been provided regarding the importance of this vaccine but patient still declined. Advised may receive this vaccine at local pharmacy or Health Dept. Aware to provide a copy of the vaccination record if obtained from local pharmacy or Health Dept. Verbalized acceptance and understanding.  Pneumococcal vaccine status: Due, Education has been provided regarding the importance of  this vaccine. Advised may receive this vaccine at local pharmacy or Health Dept. Aware to provide a copy of the vaccination record if obtained from local pharmacy or Health Dept. Verbalized acceptance and understanding.  Covid-19 vaccine status: Completed vaccines  Qualifies for Shingles Vaccine? Yes   Zostavax completed No   Shingrix Completed?: No.    Education has been provided regarding the importance of this vaccine. Patient has been advised to call insurance company to determine out of pocket expense if they have not yet received this vaccine. Advised may also receive vaccine at local pharmacy or Health Dept. Verbalized acceptance and understanding.  Screening Tests Health Maintenance  Topic Date Due   Hepatitis C Screening  Never done   DTaP/Tdap/Td (1 - Tdap) Never done   DEXA SCAN  Never done   MAMMOGRAM  05/27/2022   Pneumonia Vaccine 44+ Years old (1 of 1 - PCV) 08/03/2023 (Originally 09/12/2014)   INFLUENZA VACCINE  08/03/2023 (Originally 02/15/2022)   COVID-19 Vaccine (4 - 2023-24 season) 08/03/2023 (Originally 03/18/2022)   Zoster Vaccines- Shingrix (1 of 2) 08/03/2023 (Originally 09/13/1999)   Medicare Annual Wellness (AWV)  09/29/2023   Fecal DNA (Cologuard)  10/10/2024   HPV VACCINES  Aged Out    Health Maintenance  Health Maintenance Due  Topic Date Due   Hepatitis C Screening  Never done   DTaP/Tdap/Td (1 - Tdap) Never done   DEXA SCAN  Never done   MAMMOGRAM  05/27/2022    Colorectal cancer screening: Type of screening: Cologuard. Completed 10/10/21. Repeat every 3 years  Mammogram status: Ordered 09/29/22. Pt provided with contact info and advised to call to schedule appt.   Decline bone scan    Additional Screening:  Hepatitis C Screening: does qualify  Vision Screening: Recommended annual ophthalmology exams for early detection of glaucoma and other disorders of the eye. Is the patient up to date with their annual eye  exam?  Yes  Who is the provider or  what is the name of the office in which the patient attends annual eye exams? Dr Truman Hayward If pt is not established with a provider, would they like to be referred to a provider to establish care? No .   Dental Screening: Recommended annual dental exams for proper oral hygiene  Community Resource Referral / Chronic Care Management: CRR required this visit?  No   CCM required this visit?  No      Plan:     I have personally reviewed and noted the following in the patient's chart:   Medical and social history Use of alcohol, tobacco or illicit drugs  Current medications and supplements including opioid prescriptions. Patient is not currently taking opioid prescriptions. Functional ability and status Nutritional status Physical activity Advanced directives List of other physicians Hospitalizations, surgeries, and ER visits in previous 12 months Vitals Screenings to include cognitive, depression, and falls Referrals and appointments  In addition, I have reviewed and discussed with patient certain preventive protocols, quality metrics, and best practice recommendations. A written personalized care plan for preventive services as well as general preventive health recommendations were provided to patient.     Willette Brace, LPN   [id number]   Nurse Notes: none

## 2022-09-30 ENCOUNTER — Other Ambulatory Visit: Payer: Self-pay | Admitting: Physician Assistant

## 2022-09-30 DIAGNOSIS — Z1231 Encounter for screening mammogram for malignant neoplasm of breast: Secondary | ICD-10-CM

## 2022-10-15 ENCOUNTER — Other Ambulatory Visit: Payer: Self-pay | Admitting: Physician Assistant

## 2022-10-28 ENCOUNTER — Ambulatory Visit
Admission: EM | Admit: 2022-10-28 | Discharge: 2022-10-28 | Disposition: A | Discharge status: Home / Self Care | Payer: 59 | Attending: Emergency Medicine | Admitting: Emergency Medicine

## 2022-10-28 DIAGNOSIS — M62830 Muscle spasm of back: Secondary | ICD-10-CM

## 2022-10-28 MED ORDER — BACLOFEN 10 MG PO TABS: 10.0000 mg | ORAL_TABLET | Freq: Three times a day (TID) | ORAL | 0 refills | Status: AC

## 2022-10-28 MED ORDER — IBUPROFEN 600 MG PO TABS: 600.0000 mg | ORAL_TABLET | Freq: Three times a day (TID) | ORAL | 0 refills | Status: DC | PRN

## 2022-10-28 MED ORDER — ACETAMINOPHEN 500 MG PO TABS: 1000.0000 mg | ORAL_TABLET | Freq: Three times a day (TID) | ORAL | 0 refills | Status: AC | PRN

## 2022-10-28 NOTE — Discharge Instructions (Signed)
The mainstay of therapy for musculoskeletal pain is reduction of inflammation and relaxation of tension which is causing inflammation.  Keep in mind, pain always begets more pain.  To help you stay ahead of your pain and inflammation, I have provided the following regimen for you:   Please begin taking Tylenol 1000 mg 3 times daily (every 8 hours) as soon as you pick up your prescriptions from the pharmacy.  It is safe to take 3,000 mg of Tylenol in a 24-hour.  Please do not exceed this amount.  Tylenol works best when taken on a scheduled basis.   You can begin taking baclofen 10 mg up to three times daily.  This is a highly effective muscle relaxer and antispasmodic which should continue to provide you with relaxation of your tense muscles, allow you to sleep well and to keep your pain under control.  If you find that this medication makes you too sleepy, you can break them in half for your daytime doses and, if needed double them for your nighttime dose.  You can also just plan to take one tablet at bedtime daily.  Do not take more than 30 mg of baclofen in a 24-hour period.   Tomorrow morning, please begin taking ibuprofen 600 mg up to 3 times daily for pain not relieved by Tylenol and baclofen.  Please keep in mind that it is always easier to treat a little bit of pain that is to treat a lot of pain.  I recommend that for the next several days, you take this medication on a scheduled basis.  After that, take it when you begin to feel the pain returning, do not wait until you are in a lot of pain.   During the day, please set aside time to apply ice to the affected area 4 times daily for 20 minutes each application.  This can be achieved by using a bag of frozen peas or corn, a Ziploc bag filled with ice and water, or Ziploc bag filled with half rubbing alcohol and half Dawn dish detergent, frozen into a slush.  Please be careful not to apply ice directly to your skin, always place a soft cloth between  you and the ice pack.  Over-the-counter products such as IcyHot and Biofreeze do not work nearly as well.   Please avoid attempts to stretch or strengthen the affected area until you are feeling completely pain-free.  Attempts to do so will only prolong the healing process.   Please consider discussing referral to physical therapy with your primary care provider.  Physical therapist are very good at teasing out the underlying cause of acute lower back pain and helping with prevention of future recurrences.   Thank you for visiting urgent care today.  We appreciate the opportunity to participate in your care.

## 2022-10-28 NOTE — ED Triage Notes (Signed)
Pt presents with left side muscle pain & twinge X 3 days.

## 2022-10-28 NOTE — ED Provider Notes (Signed)
[facility]    CSN: [id number] Arrival date & time: 10/28/22  1326    HISTORY   Chief Complaint  Patient presents with   Muscle Pain   HPI Tracy Briggs is a pleasant, 72 y.o. female who presents to urgent care today. Pt presents with left sided low back muscle pain that she describes as a "twinge" with certain movements.  Patient states this has been going on for 3 days.  She states she lives alone and carries her own multi packs of water bottles into her house from her car.  States she has noticed recently that she has had to decrease the size of her multipack from 40 bottles to 30 bottles due to their weight.  Patient states she has had a similar issue in the past a few years ago, was given medication which resolved her pain.  EMR reviewed, it appears patient has been prescribed baclofen in the past.  Patient states has been taking Tylenol 500 mg which helps some but took 1000 mg last night and slept very well.  The history is provided by the patient.   Past Medical History:  Diagnosis Date   Hypertension    Patient Active Problem List   Diagnosis Date Noted   Hyperlipidemia 01/03/2022   Essential hypertension 12/17/2020   Past Surgical History:  Procedure Laterality Date   TUBAL LIGATION     OB History   No obstetric history on file.    Home Medications    Prior to Admission medications   Medication Sig Start Date End Date Taking? Authorizing Provider  losartan-hydrochlorothiazide (HYZAAR) 100-12.5 MG tablet TAKE 1 TABLET BY MOUTH EVERY DAY 10/17/22   Jarold Motto, PA  Multiple Vitamin (MULTIVITAMIN) tablet Take 1 tablet by mouth daily.    [provider]    Family History Family History  Problem Relation Age of Onset   Hypertension Mother    Hypertension Father    Breast cancer Neg Hx    Social History Social History   Tobacco Use   Smoking status: Former    Types: Cigarettes    Start date: 06/26/1970    Quit date: 06/27/1971     Years since quitting: 51.3    Passive exposure: Never   Smokeless tobacco: Never  Vaping Use   Vaping Use: Never used  Substance Use Topics   Alcohol use: Not Currently   Drug use: Not Currently   Allergies   Codeine  Review of Systems Review of Systems Pertinent findings revealed after performing a 14 point review of systems has been noted in the history of present illness.  Physical Exam Vital Signs BP (!) 188/77 (BP Location: Right Arm)   Pulse 73   Temp 97.6 F (36.4 C) (Oral)   Resp 17   SpO2 99%   No data found.  Physical Exam Vitals and nursing note reviewed.  Constitutional:      General: She is not in acute distress.    Appearance: Normal appearance.  HENT:     Head: Normocephalic and atraumatic.  Eyes:     Pupils: Pupils are equal, round, and reactive to light.  Cardiovascular:     Rate and Rhythm: Normal rate and regular rhythm.  Pulmonary:     Effort: Pulmonary effort is normal.     Breath sounds: Normal breath sounds.  Musculoskeletal:        General: Normal range of motion.     Cervical back: Normal range of motion and neck supple.  Lumbar back: Spasms and tenderness present.  Skin:    General: Skin is warm and dry.  Neurological:     General: No focal deficit present.     Mental Status: She is alert and oriented to person, place, and time. Mental status is at baseline.  Psychiatric:        Mood and Affect: Mood normal.        Behavior: Behavior normal.        Thought Content: Thought content normal.        Judgment: Judgment normal.     Visual Acuity Right Eye Distance:   Left Eye Distance:   Bilateral Distance:    Right Eye Near:   Left Eye Near:    Bilateral Near:     UC Couse / Diagnostics / Procedures:     Radiology No results found.  Procedures Procedures (including critical care time) EKG  Pending results:  Labs Reviewed - No data to display  Medications Ordered in UC: Medications - No data to display  UC  Diagnoses / Final Clinical Impressions(s)   I have reviewed the triage vital signs and the nursing notes.  Pertinent labs & imaging results that were available during my care of the patient were reviewed by me and considered in my medical decision making (see chart for details).    Final diagnoses:  Muscle spasm of back   Patient provided with acetaminophen and ibuprofen for pain.  Baclofen provided to reduce muscle spasm.  Conservative care recommended, return precautions advised.  Please see discharge instructions below for details of plan of care as provided to patient. ED Prescriptions     Medication Sig Dispense Auth. Provider   baclofen (LIORESAL) 10 MG tablet Take 1 tablet (10 mg total) by mouth 3 (three) times daily for 7 days. 21 tablet Theadora Rama Scales, PA-C   acetaminophen (TYLENOL) 500 MG tablet Take 2 tablets (1,000 mg total) by mouth every 8 (eight) hours as needed for up to 30 doses for mild pain or fever. 60 tablet Theadora Rama Scales, PA-C   ibuprofen (ADVIL) 600 MG tablet Take 1 tablet (600 mg total) by mouth every 8 (eight) hours as needed for up to 30 doses for fever, headache, mild pain or moderate pain (Inflammation). Take 1 tablet 3 times daily as needed for inflammation of upper airways and/or pain. 30 tablet Theadora Rama Scales, PA-C      PDMP not reviewed this encounter.  Pending results:  Labs Reviewed - No data to display  Discharge Instructions:   Discharge Instructions      The mainstay of therapy for musculoskeletal pain is reduction of inflammation and relaxation of tension which is causing inflammation.  Keep in mind, pain always begets more pain.  To help you stay ahead of your pain and inflammation, I have provided the following regimen for you:   Please begin taking Tylenol 1000 mg 3 times daily (every 8 hours) as soon as you pick up your prescriptions from the pharmacy.  It is safe to take 3,000 mg of Tylenol in a 24-hour.  Please do  not exceed this amount.  Tylenol works best when taken on a scheduled basis.   You can begin taking baclofen 10 mg up to three times daily.  This is a highly effective muscle relaxer and antispasmodic which should continue to provide you with relaxation of your tense muscles, allow you to sleep well and to keep your pain under control.  If you find that this  medication makes you too sleepy, you can break them in half for your daytime doses and, if needed double them for your nighttime dose.  You can also just plan to take one tablet at bedtime daily.  Do not take more than 30 mg of baclofen in a 24-hour period.   Tomorrow morning, please begin taking ibuprofen 600 mg up to 3 times daily for pain not relieved by Tylenol and baclofen.  Please keep in mind that it is always easier to treat a little bit of pain that is to treat a lot of pain.  I recommend that for the next several days, you take this medication on a scheduled basis.  After that, take it when you begin to feel the pain returning, do not wait until you are in a lot of pain.   During the day, please set aside time to apply ice to the affected area 4 times daily for 20 minutes each application.  This can be achieved by using a bag of frozen peas or corn, a Ziploc bag filled with ice and water, or Ziploc bag filled with half rubbing alcohol and half Dawn dish detergent, frozen into a slush.  Please be careful not to apply ice directly to your skin, always place a soft cloth between you and the ice pack.  Over-the-counter products such as IcyHot and Biofreeze do not work nearly as well.   Please avoid attempts to stretch or strengthen the affected area until you are feeling completely pain-free.  Attempts to do so will only prolong the healing process.   Please consider discussing referral to physical therapy with your primary care provider.  Physical therapist are very good at teasing out the underlying cause of acute lower back pain and helping  with prevention of future recurrences.   Thank you for visiting urgent care today.  We appreciate the opportunity to participate in your care.       Disposition Upon Discharge:  Condition: stable for discharge home  Patient presented with an acute illness with associated systemic symptoms and significant discomfort requiring urgent management. In my opinion, this is a condition that a prudent lay person (someone who possesses an average knowledge of health and medicine) may potentially expect to result in complications if not addressed urgently such as respiratory distress, impairment of bodily function or dysfunction of bodily organs.   Routine symptom specific, illness specific and/or disease specific instructions were discussed with the patient and/or caregiver at length.   As such, the patient has been evaluated and assessed, work-up was performed and treatment was provided in alignment with urgent care protocols and evidence based medicine.  Patient/parent/caregiver has been advised that the patient may require follow up for further testing and treatment if the symptoms continue in spite of treatment, as clinically indicated and appropriate.  Patient/parent/caregiver has been advised to return to the [facility] or PCP if no better; to PCP or the Emergency Department if new signs and symptoms develop, or if the current signs or symptoms continue to change or worsen for further workup, evaluation and treatment as clinically indicated and appropriate  The patient will follow up with their current PCP if and as advised. If the patient does not currently have a PCP we will assist them in obtaining one.   The patient may need specialty follow up if the symptoms continue, in spite of conservative treatment and management, for further workup, evaluation, consultation and treatment as clinically indicated and appropriate.  Patient/parent/caregiver verbalized understanding and  agreement of plan as  discussed.  All questions were addressed during visit.  Please see discharge instructions below for further details of plan.  This office note has been dictated using Teaching laboratory technician.  Unfortunately, this method of dictation can sometimes lead to typographical or grammatical errors.  I apologize for your inconvenience in advance if this occurs.  Please do not hesitate to reach out to me if clarification is needed.      Theadora Rama Scales, [place] 10/29/22 [phone]

## 2022-11-17 ENCOUNTER — Ambulatory Visit
Admission: RE | Admit: 2022-11-17 | Discharge: 2022-11-17 | Disposition: A | Discharge status: Home / Self Care | Payer: 59 | Source: Ambulatory Visit | Attending: Physician Assistant | Admitting: Physician Assistant

## 2022-11-17 DIAGNOSIS — Z1231 Encounter for screening mammogram for malignant neoplasm of breast: Secondary | ICD-10-CM | POA: Diagnosis not present

## 2022-12-22 ENCOUNTER — Encounter: Payer: Self-pay | Admitting: Physician Assistant

## 2022-12-22 ENCOUNTER — Telehealth: Payer: Self-pay | Admitting: Physician Assistant

## 2022-12-22 ENCOUNTER — Ambulatory Visit (INDEPENDENT_AMBULATORY_CARE_PROVIDER_SITE_OTHER): Payer: 59 | Admitting: Physician Assistant

## 2022-12-22 VITALS — BP 166/72 | HR 75 | Temp 97.7°F | Ht 67.0 in | Wt 199.4 lb

## 2022-12-22 DIAGNOSIS — M549 Dorsalgia, unspecified: Secondary | ICD-10-CM

## 2022-12-22 DIAGNOSIS — I10 Essential (primary) hypertension: Secondary | ICD-10-CM | POA: Diagnosis not present

## 2022-12-22 LAB — POCT URINALYSIS DIPSTICK
Bilirubin, UA: NEGATIVE
Blood, UA: NEGATIVE
Glucose, UA: NEGATIVE
Ketones, UA: NEGATIVE
Leukocytes, UA: NEGATIVE
Nitrite, UA: NEGATIVE
Protein, UA: NEGATIVE
Spec Grav, UA: 1.01 (ref 1.010–1.025)
Urobilinogen, UA: 0.2 E.U./dL
pH, UA: 6.5 (ref 5.0–8.0)

## 2022-12-22 MED ORDER — BACLOFEN 10 MG PO TABS: 10.0000 mg | ORAL_TABLET | Freq: Three times a day (TID) | ORAL | 0 refills | Status: AC | PRN

## 2022-12-22 MED ORDER — IBUPROFEN 600 MG PO TABS: 600.0000 mg | ORAL_TABLET | Freq: Three times a day (TID) | ORAL | 0 refills | Status: AC | PRN

## 2022-12-22 NOTE — Progress Notes (Signed)
Tracy Briggs is a 72 y.o. female here for a recurrence of a previously resolved problem.  History of Present Illness:   Chief Complaint  Patient presents with   Back issue    Pt still c/o back discomfort on left lower side, feels like pulled something. She was seen at [facility] on 4/12 for issue.    HPI  Back spasms: She was seen at [facility] on 4/12 after waking up with back pain.  She believes it was attributed to picking up a pack of water bottles at the store.  At the UC it was believed she had pulled a muscle.  She was given a muscle relaxer, Ibuprofen, and Tylenol, which all helped her pain subside.  States her back pain has recently returned at her lower left back.  She has been taking Ibuprofen as needed.  Denies any urinary symptoms or pain radiating down her leg.   Hypertension: Her blood pressure is elevated during this visit.  She is taking losartan-hydrochloroTHIAZIDE 100-12.5 mg daily and tolerating. Denies chest pain, shortness of breath Has reported white coat hypertension  BP Readings from Last 3 Encounters:  12/22/22 (!) 140/80  10/28/22 (!) 188/77  09/19/22 (!) 172/83    Past Medical History:  Diagnosis Date   Hypertension      Social History   Tobacco Use   Smoking status: Former    Types: Cigarettes    Start date: 06/26/1970    Quit date: 06/27/1971    Years since quitting: 51.5    Passive exposure: Never   Smokeless tobacco: Never  Vaping Use   Vaping Use: Never used  Substance Use Topics   Alcohol use: Not Currently   Drug use: Not Currently    Past Surgical History:  Procedure Laterality Date   TUBAL LIGATION      Family History  Problem Relation Age of Onset   Hypertension Mother    Hypertension Father    Breast cancer Neg Hx     Allergies  Allergen Reactions   Codeine Hives    Current Medications:   Current Outpatient Medications:    acetaminophen (TYLENOL) 500 MG tablet, Take 2 tablets (1,000 mg total) by mouth every 8 (eight)  hours as needed for up to 30 doses for mild pain or fever., Disp: 60 tablet, Rfl: 0   baclofen (LIORESAL) 10 MG tablet, Take 1 tablet (10 mg total) by mouth 3 (three) times daily as needed for muscle spasms., Disp: 30 each, Rfl: 0   losartan-hydrochlorothiazide (HYZAAR) 100-12.5 MG tablet, TAKE 1 TABLET BY MOUTH EVERY DAY, Disp: 90 tablet, Rfl: 0   Multiple Vitamin (MULTIVITAMIN) tablet, Take 1 tablet by mouth daily., Disp: , Rfl:    ibuprofen (ADVIL) 600 MG tablet, Take 1 tablet (600 mg total) by mouth every 8 (eight) hours as needed for up to 30 doses for fever, headache, mild pain or moderate pain (Inflammation). Take 1 tablet 3 times daily as needed for inflammation of upper airways and/or pain., Disp: 30 tablet, Rfl: 0   Review of Systems:   ROS Negative unless otherwise specified per HPI.  Vitals:   Vitals:   12/22/22 1015  BP: (!) 140/80  Pulse: 75  Temp: 97.7 F (36.5 C)  TempSrc: Temporal  SpO2: 100%  Weight: 199 lb 6.1 oz (90.4 kg)  Height: 5\' 7"  (1.702 m)     Body mass index is 31.23 kg/m.  Physical Exam:   Physical Exam Vitals and nursing note reviewed.  Constitutional:  General: She is not in acute distress.    Appearance: She is well-developed. She is not ill-appearing or toxic-appearing.  Cardiovascular:     Rate and Rhythm: Normal rate and regular rhythm.     Pulses: Normal pulses.     Heart sounds: Normal heart sounds, S1 normal and S2 normal.  Pulmonary:     Effort: Pulmonary effort is normal.     Breath sounds: Normal breath sounds.  Musculoskeletal:     Comments: Decreased ROM 2/2 pain with lateral side bends. No reproducible tenderness with deep palpation to bilateral paraspinal muscles. No bony tenderness.  Skin:    General: Skin is warm and dry.  Neurological:     Mental Status: She is alert.     GCS: GCS eye subscore is 4. GCS verbal subscore is 5. GCS motor subscore is 6.  Psychiatric:        Speech: Speech normal.        Behavior:  Behavior normal. Behavior is cooperative.    Results for orders placed or performed in visit on 12/22/22  POCT urinalysis dipstick  Result Value Ref Range   Color, UA yellow    Clarity, UA clear    Glucose, UA Negative Negative   Bilirubin, UA Negative    Ketones, UA Negative    Spec Grav, UA 1.010 1.010 - 1.025   Blood, UA Negatvie    pH, UA 6.5 5.0 - 8.0   Protein, UA Negative Negative   Urobilinogen, UA 0.2 0.2 or 1.0 E.U./dL   Nitrite, UA Negative    Leukocytes, UA Negative Negative   Appearance     Odor      Assessment and Plan:   Discomfort of back UA negative No red flags She would like to see sports medicine I have refilled her baclofen and ibuprofen - encouraged use of tylenol more so than ibuprofen due to hypertension If any sudden worsening in the meantime, needs to go to the er  Essential hypertension Above goal today No evidence of end-organ damage on my exam Recommend patient monitor home blood pressure at least a few times weekly Continue losartan-hydrochloroTHIAZIDE 100-12.5 mg daily If home monitoring shows consistent elevation, or any symptom(s) develop, recommend reach out to [place] for further advice on next steps   I,Rachel Rivera,acting as a scribe for [employer], PA.,have documented all relevant documentation on the behalf of Jarold Motto, PA,as directed by  Jarold Motto, PA while in the presence of Jarold Motto, [place].  I, Jarold Motto, [place], have reviewed all documentation for this visit. The documentation on 12/22/22 for the exam, diagnosis, procedures, and orders are all accurate and complete.   Jarold Motto, PA-C

## 2022-12-22 NOTE — Telephone Encounter (Signed)
Pt was not sure whether she needed a f/u but scheduled anyway. If not needed, please let us know & we will inform patient.

## 2022-12-22 NOTE — Patient Instructions (Signed)
It was great to see you!  A referral has been placed for you to see one of our fantastic providers at [facility]. Someone from their office will be in touch soon regarding scheduling your appointment.  Their location:  [facility] at [facility]  [address] on the 1st floor Phone number [phone] Fax [phone].   This location is across the street from the entrance to [facility] and in the same complex as the [facility]  I will refill your ibuprofen and baclofen  Keep an eye on your blood pressure today  Take care,  Jarold Motto PA-C

## 2022-12-22 NOTE — Telephone Encounter (Signed)
Pt is due for physical, appt is fine in August, just change to physical. Thanks.

## 2022-12-23 LAB — URINE CULTURE
MICRO NUMBER:: 15050161
SPECIMEN QUALITY:: ADEQUATE

## 2022-12-29 ENCOUNTER — Ambulatory Visit: Payer: 59 | Admitting: Sports Medicine

## 2023-01-12 ENCOUNTER — Other Ambulatory Visit: Payer: Self-pay | Admitting: Physician Assistant

## 2023-02-23 ENCOUNTER — Encounter: Payer: 59 | Admitting: Physician Assistant

## 2023-03-21 ENCOUNTER — Ambulatory Visit: Payer: 59

## 2023-03-21 ENCOUNTER — Encounter: Payer: Self-pay | Admitting: Emergency Medicine

## 2023-03-21 ENCOUNTER — Ambulatory Visit
Admission: EM | Admit: 2023-03-21 | Discharge: 2023-03-21 | Disposition: A | Discharge status: Home / Self Care | Payer: 59 | Attending: Internal Medicine | Admitting: Internal Medicine

## 2023-03-21 ENCOUNTER — Other Ambulatory Visit: Payer: Self-pay

## 2023-03-21 DIAGNOSIS — M79642 Pain in left hand: Secondary | ICD-10-CM | POA: Diagnosis not present

## 2023-03-21 DIAGNOSIS — W19XXXA Unspecified fall, initial encounter: Secondary | ICD-10-CM

## 2023-03-21 DIAGNOSIS — M1812 Unilateral primary osteoarthritis of first carpometacarpal joint, left hand: Secondary | ICD-10-CM | POA: Diagnosis not present

## 2023-03-21 DIAGNOSIS — S0081XA Abrasion of other part of head, initial encounter: Secondary | ICD-10-CM | POA: Diagnosis not present

## 2023-03-21 DIAGNOSIS — R030 Elevated blood-pressure reading, without diagnosis of hypertension: Secondary | ICD-10-CM

## 2023-03-21 MED ORDER — TETANUS-DIPHTH-ACELL PERTUSSIS 5-2.5-18.5 LF-MCG/0.5 IM SUSY
0.5000 mL | PREFILLED_SYRINGE | Freq: Once | INTRAMUSCULAR | Status: AC
  Administered 2023-03-21: 0.5 mL via INTRAMUSCULAR

## 2023-03-21 NOTE — ED Provider Notes (Signed)
[facility]    CSN: [id number] Arrival date & time: 03/21/23  1500      History   Chief Complaint Chief Complaint  Patient presents with   Fall    HPI Tracy Briggs is a 72 y.o. female.   Patient presents with left hand pain and abrasion to the left side of her face that occurred today after a fall in her driveway.  Patient reports that she thinks that she tripped over her feet falling forward catching herself with her left hand.  Denies loss of consciousness.  Denies any headache, dizziness, blurred vision, nausea, vomiting.  She is not sure of last tetanus vaccination.  She has not taken anything for pain.  Blood pressure elevated but the patient is attributing it to fall and pain.  She took her blood pressure medication as prescribed and blood pressure this morning was 130 systolic.  She is not reporting any chest pain, shortness of breath, dizziness. She does not take any blood thinning medications.    Fall    Past Medical History:  Diagnosis Date   Hypertension     Patient Active Problem List   Diagnosis Date Noted   Hyperlipidemia 01/03/2022   Essential hypertension 12/17/2020    Past Surgical History:  Procedure Laterality Date   TUBAL LIGATION      OB History   No obstetric history on file.      Home Medications    Prior to Admission medications   Medication Sig Start Date End Date Taking? Authorizing Provider  acetaminophen (TYLENOL) 500 MG tablet Take 2 tablets (1,000 mg total) by mouth every 8 (eight) hours as needed for up to 30 doses for mild pain or fever. 10/28/22   Theadora Rama Scales, PA-C  baclofen (LIORESAL) 10 MG tablet Take 1 tablet (10 mg total) by mouth 3 (three) times daily as needed for muscle spasms. 12/22/22   Jarold Motto, PA  ibuprofen (ADVIL) 600 MG tablet Take 1 tablet (600 mg total) by mouth every 8 (eight) hours as needed for up to 30 doses for fever, headache, mild pain or moderate pain (Inflammation). Take 1  tablet 3 times daily as needed for inflammation of upper airways and/or pain. 12/22/22   Jarold Motto, PA  losartan-hydrochlorothiazide [facility]) 100-12.5 MG tablet TAKE 1 TABLET BY MOUTH EVERY DAY 01/12/23   Jarold Motto, PA  Multiple Vitamin (MULTIVITAMIN) tablet Take 1 tablet by mouth daily.    [provider]    Family History Family History  Problem Relation Age of Onset   Hypertension Mother    Hypertension Father    Breast cancer Neg Hx     Social History Social History   Tobacco Use   Smoking status: Former    Current packs/day: 0.00    Types: Cigarettes    Start date: 06/26/1970    Quit date: 06/27/1971    Years since quitting: 51.7    Passive exposure: Never   Smokeless tobacco: Never  Vaping Use   Vaping status: Never Used  Substance Use Topics   Alcohol use: Not Currently   Drug use: Not Currently     Allergies   Codeine   Review of Systems Review of Systems Per HPI  Physical Exam Triage Vital Signs ED Triage Vitals [03/21/23 1630]  Encounter Vitals Group     BP (!) 186/85     Systolic BP Percentile      Diastolic BP Percentile      Pulse Rate 73  Resp 18     Temp 98.4 F (36.9 C)     Temp Source Oral     SpO2 100 %     Weight      Height      Head Circumference      Peak Flow      Pain Score 4     Pain Loc      Pain Education      Exclude from Growth Chart    No data found.  Updated Vital Signs BP (!) 186/85 (BP Location: Left Arm)   Pulse 73   Temp 98.4 F (36.9 C) (Oral)   Resp 18   SpO2 100%   Visual Acuity Right Eye Distance:   Left Eye Distance:   Bilateral Distance:    Right Eye Near:   Left Eye Near:    Bilateral Near:     Physical Exam Constitutional:      General: She is not in acute distress.    Appearance: Normal appearance. She is not toxic-appearing or diaphoretic.  HENT:     Head: Normocephalic and atraumatic.      Comments: Patient has a very small irregular shaped superficial abrasion  with no bleeding present to left lateral face directly adjacent to eye. No tenderness to palpation surrounding.  Eyes:     Extraocular Movements: Extraocular movements intact.     Conjunctiva/sclera: Conjunctivae normal.  Pulmonary:     Effort: Pulmonary effort is normal.  Musculoskeletal:     Comments: Mild tenderness to palpation to palmar surface of hand directly below first digit.  No other tenderness to hand or wrist.  Full range of motion of fingers present.  No lacerations or abrasions noted.  No swelling or discoloration.  Capillary refill and pulses are intact.  Neurological:     General: No focal deficit present.     Mental Status: She is alert and oriented to person, place, and time. Mental status is at baseline.     Cranial Nerves: Cranial nerves 2-12 are intact.     Sensory: Sensation is intact.     Motor: Motor function is intact.     Coordination: Coordination is intact.     Gait: Gait is intact.  Psychiatric:        Mood and Affect: Mood normal.        Behavior: Behavior normal.        Thought Content: Thought content normal.        Judgment: Judgment normal.      UC Treatments / Results  Labs (all labs ordered are listed, but only abnormal results are displayed) Labs Reviewed - No data to display  EKG   Radiology DG Hand Complete Left  Result Date: 03/21/2023 CLINICAL DATA:  Fall.  Hand pain. EXAM: LEFT HAND - COMPLETE 3+ VIEW COMPARISON:  None available FINDINGS: There is diffuse decreased bone mineralization. 2 mm ulnar negative variance. Moderate thumb carpometacarpal joint space narrowing, subchondral sclerosis, and peripheral osteophytosis. Mild triscaphe joint space narrowing. Mild-to-moderate interphalangeal joint space narrowing and peripheral osteophytosis diffusely. No acute fracture or dislocation. IMPRESSION: 1. No acute fracture. 2. 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 Electronically Signed   By: Neita Garnet M.D.   On: 03/21/2023 18:05    Procedures Procedures (including critical care time)  Medications Ordered in UC Medications  Tdap (BOOSTRIX) injection 0.5 mL (0.5 mLs Intramuscular Given 03/21/23 1651)    Initial Impression / Assessment and Plan / UC Course  I have  reviewed the triage vital signs and the nursing notes.  Pertinent labs & imaging results that were available during my care of the patient were reviewed by me and considered in my medical decision making (see chart for details).     Patient has facial abrasion but no direct bony tenderness surrounding.  Discussed with patient that we do not have imaging capabilities in urgent care and she voiced understanding of this.  Given patient is not having any headache or neurological symptoms and neuroexam is normal, do not think imaging is necessary but discussed with patient strict ER precautions if she develops these symptoms.  Patient is not taking blood thinning medications as well which is reassuring.  Left hand x-ray was negative for any acute bony abnormality.  Attempted to call patient to discuss x-ray results but no answer.  Suspect contusion to hand related to mechanism of injury.  Advised ice application and supportive care related to hand pain.  Advised to monitor abrasion for signs of infection and to follow-up sooner if they occur.  Tetanus vaccine updated today given abrasion.  Advised strict return precautions.  Blood pressure elevated but patient is attributing this to recent fall and pain.  Reports blood pressure was normal this morning.  Currently asymptomatic regarding blood pressure so advised her to monitor diligently at home and follow-up if it remains elevated.  Patient verbalized understanding and was agreeable with plan. Final Clinical Impressions(s) / UC Diagnoses   Final diagnoses:  Fall, initial encounter  Left hand pain  Abrasion, face w/o infection  Elevated blood pressure reading   Discharge  Instructions   None    ED Prescriptions   None    PDMP not reviewed this encounter.   Gustavus Bryant, [place] 03/21/23 Rickey Primus

## 2023-03-21 NOTE — ED Triage Notes (Signed)
Pt with trip and fall today in driveway; pt sts hit left side of face; abrasion noted and has pain in left hand from bracing for fall; pt denies LOC

## 2023-03-28 NOTE — Progress Notes (Signed)
Tracy Briggs is a 72 y.o. female here for a new problem.  History of Present Illness:   No chief complaint on file.   HPI Tinnitus: Complains of ringing in ears that began *** ago  ***  ***  ***   Past Medical History:  Diagnosis Date   Hypertension      Social History   Tobacco Use   Smoking status: Former    Current packs/day: 0.00    Types: Cigarettes    Start date: 06/26/1970    Quit date: 06/27/1971    Years since quitting: 51.7    Passive exposure: Never   Smokeless tobacco: Never  Vaping Use   Vaping status: Never Used  Substance Use Topics   Alcohol use: Not Currently   Drug use: Not Currently    Past Surgical History:  Procedure Laterality Date   TUBAL LIGATION      Family History  Problem Relation Age of Onset   Hypertension Mother    Hypertension Father    Breast cancer Neg Hx     Allergies  Allergen Reactions   Codeine Hives    Current Medications:   Current Outpatient Medications:    acetaminophen (TYLENOL) 500 MG tablet, Take 2 tablets (1,000 mg total) by mouth every 8 (eight) hours as needed for up to 30 doses for mild pain or fever., Disp: 60 tablet, Rfl: 0   baclofen (LIORESAL) 10 MG tablet, Take 1 tablet (10 mg total) by mouth 3 (three) times daily as needed for muscle spasms., Disp: 30 each, Rfl: 0   ibuprofen (ADVIL) 600 MG tablet, Take 1 tablet (600 mg total) by mouth every 8 (eight) hours as needed for up to 30 doses for fever, headache, mild pain or moderate pain (Inflammation). Take 1 tablet 3 times daily as needed for inflammation of upper airways and/or pain., Disp: 30 tablet, Rfl: 0   losartan-hydrochlorothiazide (HYZAAR) 100-12.5 MG tablet, TAKE 1 TABLET BY MOUTH EVERY DAY, Disp: 90 tablet, Rfl: 0   Multiple Vitamin (MULTIVITAMIN) tablet, Take 1 tablet by mouth daily., Disp: , Rfl:    Review of Systems:   ROS  Vitals:   There were no vitals filed for this visit.   There is no height or weight on file to calculate  BMI.  Physical Exam:   Physical Exam  Assessment and Plan:   There are no diagnoses linked to this encounter.        I,Emily Lagle,acting as a Neurosurgeon for [employer], PA.,have documented all relevant documentation on the behalf of Jarold Motto, PA,as directed by  Jarold Motto, PA while in the presence of Jarold Motto, [place].  *** I, Larey Brick, have reviewed all documentation for this visit. The documentation on 03/28/23 for the exam, diagnosis, procedures, and orders are all accurate and complete.  Jarold Motto, PA-C

## 2023-03-30 ENCOUNTER — Encounter: Payer: Self-pay | Admitting: Physician Assistant

## 2023-03-30 ENCOUNTER — Ambulatory Visit (INDEPENDENT_AMBULATORY_CARE_PROVIDER_SITE_OTHER): Payer: 59 | Admitting: Physician Assistant

## 2023-03-30 VITALS — BP 138/78 | HR 78 | Temp 97.3°F | Ht 67.0 in | Wt 187.2 lb

## 2023-03-30 DIAGNOSIS — H9311 Tinnitus, right ear: Secondary | ICD-10-CM

## 2023-03-30 MED ORDER — IPRATROPIUM BROMIDE 0.03 % NA SOLN: 2.0000 | Freq: Two times a day (BID) | NASAL | 12 refills | Status: AC

## 2023-03-30 NOTE — Patient Instructions (Signed)
It was great to see you!  Trial nasal spray for your symptoms  I will place referral to Ear/Nose/Throat -- please see them if symptom(s) persist  Take care,  Jarold Motto PA-C

## 2023-04-08 ENCOUNTER — Other Ambulatory Visit: Payer: Self-pay | Admitting: Physician Assistant

## 2023-06-29 ENCOUNTER — Ambulatory Visit (INDEPENDENT_AMBULATORY_CARE_PROVIDER_SITE_OTHER): Payer: 59 | Admitting: Otolaryngology

## 2023-06-29 ENCOUNTER — Encounter (INDEPENDENT_AMBULATORY_CARE_PROVIDER_SITE_OTHER): Payer: Self-pay

## 2023-06-29 ENCOUNTER — Ambulatory Visit (INDEPENDENT_AMBULATORY_CARE_PROVIDER_SITE_OTHER): Payer: 59 | Admitting: Audiology

## 2023-06-29 VITALS — Ht 64.0 in | Wt 185.0 lb

## 2023-06-29 DIAGNOSIS — H9313 Tinnitus, bilateral: Secondary | ICD-10-CM

## 2023-06-29 DIAGNOSIS — H6121 Impacted cerumen, right ear: Secondary | ICD-10-CM

## 2023-06-29 DIAGNOSIS — H903 Sensorineural hearing loss, bilateral: Secondary | ICD-10-CM

## 2023-06-29 DIAGNOSIS — Z0110 Encounter for examination of ears and hearing without abnormal findings: Secondary | ICD-10-CM | POA: Diagnosis not present

## 2023-06-29 NOTE — Progress Notes (Signed)
  [address], [address] [place], [place] [address] [phone]  Audiological Evaluation    Name: Tracy Briggs     DOB:   08/10/1950      MRN:   [id number]                                                                                     Service Date: 06/29/2023        Patient was referred today for a hearing evaluation by Dr. Karle Barr.   Symptoms Yes Details  Hearing loss  []  Patient denied perceiving any hearing loss.  Tinnitus  [x]  Patient reported experiencing bilateral tinnitus.  Balance problems  []  Patient denied vertigo/imbalance sensations.  Previous ear surgeries  []  Patient denied any previous ear surgeries.  Family history  []  Patient denied family history of hearing loss.  Amplification  []  Patient denied the use of hearing aids.    Tympanogram: Right ear: Normal external ear canal volume with normal middle ear pressure and low tympanic membrane compliance (Type As). Left ear: Normal external ear canal volume with normal middle ear pressure and tympanic membrane compliance (Type A).    Hearing Evaluation: The audiogram was completed using conventional audiometric techniques under headphones with good reliability.   The hearing test results indicate: Right ear: Normal hearing sensitivity from [phone]. Left ear: Normal hearing sensitivity from [phone].  Speech Audiometry: Right ear- Speech Reception Threshold (SRT) was obtained at 20 dBHL. Left ear- Speech Reception Threshold (SRT) was obtained at 20 dBHL.   Word Recognition Score Tested using NU-6 (MLV) Right ear: 100% was obtained at a presentation level of 60 dBHL which is deemed as excellent understanding. Left ear: 100% was obtained at a presentation level of 60 dBHL which is deemed as excellent understanding.    Impression:  There is not a significant difference between puretone thresholds and word recognition scores between ears.   Recommendations: Repeat audiogram when changes  are perceived or per MD. Consider various tinnitus strategies, including the use of a noise generator or tinnitus retraining therapy.   Conley Rolls Rheya Minogue, AUD, CCC-A 06/29/23

## 2023-06-29 NOTE — Addendum Note (Signed)
Addended byNewman Pies on: 06/29/2023 01:50 PM   Modules accepted: Orders

## 2023-06-29 NOTE — Progress Notes (Signed)
Patient ID: Tracy Briggs, female   DOB: 25-May-1951, 72 y.o.   MRN: [id number]  CC: Bilateral tinnitus  HPI:  Tracy Briggs is a 72 y.o. female who presents today complaining of bilateral tinnitus for the past 3 months.  She describes her tinnitus as a high-pitched ringing noise.  It is nonpulsatile.  She denies any otalgia, otorrhea, or vertigo.  She has no recent history of otitis media or otitis externa.  She denies any history of otologic surgery.  She also denies any significant hearing difficulty.  Past Medical History:  Diagnosis Date   Hypertension     Past Surgical History:  Procedure Laterality Date   TUBAL LIGATION      Family History  Problem Relation Age of Onset   Hypertension Mother    Hypertension Father    Breast cancer Neg Hx     Social History:  reports that she quit smoking about 52 years ago. Her smoking use included cigarettes. She started smoking about 53 years ago. She has never been exposed to tobacco smoke. She has never used smokeless tobacco. She reports that she does not currently use alcohol. She reports that she does not currently use drugs.  Allergies:  Allergies  Allergen Reactions   Codeine Hives    Prior to Admission medications   Medication Sig Start Date End Date Taking? Authorizing Provider  acetaminophen (TYLENOL) 500 MG tablet Take 2 tablets (1,000 mg total) by mouth every 8 (eight) hours as needed for up to 30 doses for mild pain or fever. 10/28/22  Yes Theadora Rama Scales, PA-C  baclofen (LIORESAL) 10 MG tablet Take 1 tablet (10 mg total) by mouth 3 (three) times daily as needed for muscle spasms. 12/22/22  Yes Jarold Motto, PA  ibuprofen (ADVIL) 600 MG tablet Take 1 tablet (600 mg total) by mouth every 8 (eight) hours as needed for up to 30 doses for fever, headache, mild pain or moderate pain (Inflammation). Take 1 tablet 3 times daily as needed for inflammation of upper airways and/or pain. 12/22/22  Yes Jarold Motto, PA   ipratropium (ATROVENT) 0.03 % nasal spray Place 2 sprays into both nostrils every 12 (twelve) hours. 03/30/23  Yes Worley, Lelon Mast, PA  losartan-hydrochlorothiazide (HYZAAR) 100-12.5 MG tablet TAKE 1 TABLET BY MOUTH EVERY DAY 04/10/23  Yes Jarold Motto, PA  Multiple Vitamin (MULTIVITAMIN) tablet Take 1 tablet by mouth daily.   Yes [provider]    Height 5\' 4"  (1.626 m), weight 185 lb (83.9 kg). Exam: General: Communicates without difficulty, well nourished, no acute distress. Head: Normocephalic, no evidence injury, no tenderness, facial buttresses intact without stepoff. Face/sinus: No tenderness to palpation and percussion. Facial movement is normal and symmetric. Eyes: PERRL, EOMI. No scleral icterus, conjunctivae clear. Neuro: CN II exam reveals vision grossly intact.  No nystagmus at any point of gaze. Ears: Auricles well formed without lesions.  Right ear cerumen impaction.  The left ear canal and tympanic membrane are normal.  Nose: External evaluation reveals normal support and skin without lesions.  Dorsum is intact.  Anterior rhinoscopy reveals congested mucosa over anterior aspect of inferior turbinates and intact septum.  No purulence noted. Oral:  Oral cavity and oropharynx are intact, symmetric, without erythema or edema.  Mucosa is moist without lesions. Neck: Full range of motion without pain.  There is no significant lymphadenopathy.  No masses palpable.  Thyroid bed within normal limits to palpation.  Parotid glands and submandibular glands equal bilaterally without mass.  Trachea is midline.  Neuro:  CN 2-12 grossly intact.   Procedure: Right ear cerumen disimpaction Anesthesia: None Description: Under the operating microscope, the cerumen is carefully removed with a combination of cerumen currette, alligator forceps, and suction catheters.  After the cerumen is removed, the TMs are noted to be normal.  No mass, erythema, or lesions. The patient tolerated the procedure  well.    The hearing test shows bilateral mild sensorineural hearing loss.  Assessment: 1.  Incidental finding of right ear cerumen impaction.  After the disimpaction procedure, both tympanic membranes and middle ear spaces are noted to be normal. 2.  Bilateral mild sensorineural hearing loss. 3.  Her tinnitus is likely a direct result of the hearing loss.  Plan: 1.  Otomicroscopy with bilateral cerumen disimpaction. 2.  The physical exam findings and the hearing test results are reviewed with the patient. 3.  The strategies to cope with tinnitus, including the use of masker, hearing aids, tinnitus retraining therapy, and avoidance of caffeine and alcohol are discussed.  4.  The patient will return for reevaluation in 1 year.  Tracy Briggs 06/29/2023, 1:28 PM

## 2023-07-06 ENCOUNTER — Other Ambulatory Visit: Payer: Self-pay | Admitting: Physician Assistant

## 2023-09-19 ENCOUNTER — Other Ambulatory Visit: Payer: Self-pay | Admitting: Physician Assistant

## 2023-09-19 MED ORDER — LOSARTAN POTASSIUM-HCTZ 100-12.5 MG PO TABS: 1.0000 | ORAL_TABLET | Freq: Every day | ORAL | 0 refills | Status: DC

## 2023-09-19 NOTE — Telephone Encounter (Signed)
 Copied from CRM [id number]. Topic: Clinical - Medication Refill >> Sep 19, 2023  1:50 PM Truddie Crumble wrote: Most Recent Primary Care Visit:  Provider: Jarold Motto  Department: [facility]  Visit Type: OFFICE VISIT  Date: 03/30/2023  Medication: losartan-hydrochlorothiazide (HYZAAR) 100-12.5 MG tablet  Has the patient contacted their pharmacy? Yes (Agent: If no, request that the patient contact the pharmacy for the refill. If patient does not wish to contact the pharmacy document the reason why and proceed with request.) (Agent: If yes, when and what did the pharmacy advise?)  Is this the correct pharmacy for this prescription? Yes If no, delete pharmacy and type the correct one.  This is the patient's preferred pharmacy:  [facility] [address] [place] - [address] [place] [address] Phone: [phone] Fax: [phone]   Has the prescription been filled recently? No  Is the patient out of the medication? Yes  Has the patient been seen for an appointment in the last year OR does the patient have an upcoming appointment? Yes  Can we respond through MyChart? Yes  Agent: Please be advised that Rx refills may take up to 3 business days. We ask that you follow-up with your pharmacy.

## 2023-10-04 ENCOUNTER — Telehealth: Payer: Self-pay | Admitting: *Deleted

## 2023-10-04 NOTE — Telephone Encounter (Signed)
 See message, please call pt and schedule Annual Wellness visit.

## 2023-10-04 NOTE — Telephone Encounter (Signed)
 Copied from CRM [id number]. Topic: Clinical - Medical Advice >> Oct 03, 2023  4:26 PM Melissa C wrote: Reason for CRM: patient received a phone call regarding annual wellness visit that she missed. She is out running errands at the moment (on 3/18) but if you can reschedule the appointment and let her know when it is or contact her tomorrow she would be able to talk regarding that appointment.

## 2023-11-01 ENCOUNTER — Ambulatory Visit

## 2023-12-13 ENCOUNTER — Ambulatory Visit (INDEPENDENT_AMBULATORY_CARE_PROVIDER_SITE_OTHER)

## 2023-12-13 VITALS — Ht 64.0 in | Wt 185.0 lb

## 2023-12-13 DIAGNOSIS — Z0000 Encounter for general adult medical examination without abnormal findings: Secondary | ICD-10-CM

## 2023-12-13 NOTE — Progress Notes (Addendum)
 Subjective:   Tracy Briggs is a 73 y.o. who presents for a Medicare Wellness preventive visit.  As a reminder, Annual Wellness Visits don't include a physical exam, and some assessments may be limited, especially if this visit is performed virtually. We may recommend an in-person follow-up visit with your provider if needed.  Visit Complete: Virtual I connected with  Geraldina Klinefelter on 12/13/23 by a audio enabled telemedicine application and verified that I am speaking with the correct person using two identifiers.  Patient Location: Home  Provider Location: Home Office  I discussed the limitations of evaluation and management by telemedicine. The patient expressed understanding and agreed to proceed.  Vital Signs: Because this visit was a virtual/telehealth visit, some criteria may be missing or patient reported. Any vitals not documented were not able to be obtained and vitals that have been documented are patient reported.  VideoDeclined- This patient declined Librarian, academic. Therefore the visit was completed with audio only.  Persons Participating in Visit: Patient.  AWV Questionnaire: Yes: Patient Medicare AWV questionnaire was completed by the patient on 12/09/23; I have confirmed that all information answered by patient is correct and no changes since this date.  Cardiac Risk Factors include: advanced age (>34men, >79 women);dyslipidemia;hypertension;obesity (BMI >30kg/m2)     Objective:     Today's Vitals   12/13/23 1001  Weight: 185 lb (83.9 kg)  Height: 5\' 4"  (1.626 m)   Body mass index is 31.76 kg/m.     12/13/2023   10:07 AM 09/29/2022    2:12 PM 09/27/2021    1:45 PM  Advanced Directives  Does Patient Have a Medical Advance Directive? No No No  Would patient like information on creating a medical advance directive? No - Patient declined No - Patient declined No - Patient declined    Current Medications (verified) Outpatient  Encounter Medications as of 12/13/2023  Medication Sig   acetaminophen  (TYLENOL ) 500 MG tablet Take 2 tablets (1,000 mg total) by mouth every 8 (eight) hours as needed for up to 30 doses for mild pain or fever.   baclofen  (LIORESAL ) 10 MG tablet Take 1 tablet (10 mg total) by mouth 3 (three) times daily as needed for muscle spasms.   ibuprofen  (ADVIL ) 600 MG tablet Take 1 tablet (600 mg total) by mouth every 8 (eight) hours as needed for up to 30 doses for fever, headache, mild pain or moderate pain (Inflammation). Take 1 tablet 3 times daily as needed for inflammation of upper airways and/or pain.   ipratropium (ATROVENT ) 0.03 % nasal spray Place 2 sprays into both nostrils every 12 (twelve) hours.   losartan -hydrochlorothiazide (HYZAAR) 100-12.5 MG tablet Take 1 tablet by mouth daily.   Multiple Vitamin (MULTIVITAMIN) tablet Take 1 tablet by mouth daily.   No facility-administered encounter medications on file as of 12/13/2023.    Allergies (verified) Codeine   History: Past Medical History:  Diagnosis Date   Hypertension    Past Surgical History:  Procedure Laterality Date   TUBAL LIGATION     Family History  Problem Relation Age of Onset   Hypertension Mother    Hypertension Father    Breast cancer Neg Hx    Social History   Socioeconomic History   Marital status: Widowed    Spouse name: Not on file   Number of children: Not on file   Years of education: Not on file   Highest education level: 12th grade  Occupational History   Not on file  Tobacco Use   Smoking status: Former    Current packs/day: 0.00    Types: Cigarettes    Start date: 06/26/1970    Quit date: 06/27/1971    Years since quitting: 52.4    Passive exposure: Never   Smokeless tobacco: Never  Vaping Use   Vaping status: Never Used  Substance and Sexual Activity   Alcohol use: Not Currently   Drug use: Not Currently   Sexual activity: Not Currently  Other Topics Concern   Not on file  Social  History Narrative   From [place]    Lives alone   Family all in [place]    Was in ministry, 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   Social Drivers of Health   Financial Resource Strain: Low Risk  (12/13/2023)   Overall Financial Resource Strain (CARDIA)    Difficulty of Paying Living Expenses: Not hard at all  Food Insecurity: No Food Insecurity (12/13/2023)   Hunger Vital Sign    Worried About Running Out of Food in the Last Year: Never true    Ran Out of Food in the Last Year: Never true  Transportation Needs: No Transportation Needs (12/13/2023)   PRAPARE - Administrator, Civil Service (Medical): No    Lack of Transportation (Non-Medical): No  Physical Activity: Insufficiently Active (12/13/2023)   Exercise Vital Sign    Days of Exercise per Week: 2 days    Minutes of Exercise per Session: 30 min  Stress: No Stress Concern Present (12/13/2023)   [employer] of Occupational Health - Occupational Stress Questionnaire    Feeling of Stress : Not at all  Social Connections: Moderately Isolated (12/13/2023)   Social Connection and Isolation Panel [NHANES]    Frequency of Communication with Friends and Family: More than three times a week    Frequency of Social Gatherings with Friends and Family: More than three times a week    Attends Religious Services: 1 to 4 times per year    Active Member of [employer] or Organizations: No    Attends Banker Meetings: Never    Marital Status: Widowed    Tobacco Counseling Counseling given: Not Answered    Clinical Intake:  Pre-visit preparation completed: Yes  Pain : No/denies pain     BMI - recorded: 31.76 Nutritional Status: BMI > 30  Obese Nutritional Risks: None Diabetes: No  Lab Results  Component Value Date   HGBA1C 5.7 01/03/2022   HGBA1C 6.6 (A) 07/22/2021     How often do you need to have someone help you when you read instructions, pamphlets, or other written materials from your doctor or pharmacy?: 1 -  Never  Interpreter Needed?: No  Information entered by :: Lamont Pilsner, LPN   Activities of Daily Living     12/09/2023    9:28 AM 09/29/2023    9:22 AM  In your present state of health, do you have any difficulty performing the following activities:  Hearing? 0 0  Vision? 0 0  Difficulty concentrating or making decisions? 0 0  Walking or climbing stairs? 0 0  Dressing or bathing? 0 0  Doing errands, shopping? 0 0  Preparing Food and eating ? N N  Using the Toilet? N N  In the past six months, have you accidently leaked urine? N N  Do you have problems with loss of bowel control? N N  Managing your Medications? N N  Managing your Finances? N N  Housekeeping or managing your Housekeeping? N  N    Patient Care Team: Alexander Iba, [place] as PCP - General (Physician Assistant)  Indicate any recent Medical Services you may have received from other than [facility] providers in the past year (date may be approximate).     Assessment:    This is a routine wellness examination for Korina.  Hearing/Vision screen Hearing Screening - Comments:: Pt denies any hearing issues  Vision Screening - Comments:: Wears rx glasses - up to date with routine eye exams with Dr Merlyn Starring     Goals Addressed             This Visit's Progress    Patient Stated       To get off blood pressure medications        Depression Screen     12/13/2023   10:05 AM 03/30/2023   10:43 AM 09/29/2022    2:11 PM 09/27/2021    1:43 PM 08/03/2021    2:31 PM 06/26/2020   10:29 AM  PHQ 2/9 Scores  PHQ - 2 Score 0 0 0 0 0 0  PHQ- 9 Score   0       Fall Risk     12/09/2023    9:28 AM 09/29/2023    9:22 AM 03/30/2023   10:43 AM 09/25/2022    9:35 AM 08/02/2022    1:21 PM  Fall Risk   Falls in the past year? 0 0 1 0 0  Number falls in past yr: 0 0 0 0 0  Injury with Fall? 0 0 0 0 0  Risk for fall due to : No Fall Risks  Impaired balance/gait Impaired vision   Follow up Falls prevention discussed  Falls  evaluation completed Falls prevention discussed Falls evaluation completed    MEDICARE RISK AT HOME:  Medicare Risk at Home Any stairs in or around the home?: (Patient-Rptd) No If so, are there any without handrails?: No Home free of loose throw rugs in walkways, pet beds, electrical cords, etc?: (Patient-Rptd) Yes Adequate lighting in your home to reduce risk of falls?: (Patient-Rptd) Yes Life alert?: (Patient-Rptd) No Use of a cane, walker or w/c?: (Patient-Rptd) No Grab bars in the bathroom?: (Patient-Rptd) No Shower chair or bench in shower?: (Patient-Rptd) No Elevated toilet seat or a handicapped toilet?: (Patient-Rptd) No  TIMED UP AND GO:  Was the test performed?  No  Cognitive Function: 6CIT completed        12/13/2023   10:08 AM 09/29/2022    2:13 PM 09/27/2021    1:47 PM  6CIT Screen  What Year? 0 points 0 points 0 points  What month? 0 points 0 points 0 points  What time? 0 points 0 points 0 points  Count back from 20 0 points 0 points 0 points  Months in reverse 2 points 0 points 2 points  Repeat phrase 4 points 0 points 2 points  Total Score 6 points 0 points 4 points    Immunizations Immunization History  Administered Date(s) Administered   Moderna Sars-Covid-2 Vaccination 11/12/2019, 12/10/2019, 06/08/2020   Tdap 03/21/2023    Screening Tests Health Maintenance  Topic Date Due   Pneumonia Vaccine 81+ Years old (1 of 1 - PCV) Never done   Zoster Vaccines- Shingrix (1 of 2) Never done   DEXA SCAN  03/29/2024 (Originally 09/12/2014)   COVID-19 Vaccine (4 - 2024-25 season) 03/29/2024 (Originally 03/19/2023)   Hepatitis C Screening  03/29/2024 (Originally 09/13/1968)   INFLUENZA VACCINE  02/16/2024   Fecal DNA (Cologuard)  10/10/2024   MAMMOGRAM  11/16/2024   Medicare Annual Wellness (AWV)  12/12/2024   DTaP/Tdap/Td (2 - Td or Tdap) 03/20/2033   HPV VACCINES  Aged Out   Meningococcal B Vaccine  Aged Out    Health Maintenance  Health Maintenance Due   Topic Date Due   Pneumonia Vaccine 17+ Years old (1 of 1 - PCV) Never done   Zoster Vaccines- Shingrix (1 of 2) Never done   Health Maintenance Items Addressed: See Nurse Notes  Additional Screening:  Vision Screening: Recommended annual ophthalmology exams for early detection of glaucoma and other disorders of the eye.  Dental Screening: Recommended annual dental exams for proper oral hygiene  Community Resource Referral / Chronic Care Management: CRR required this visit?  No   CCM required this visit?  No   Plan:    I have personally reviewed and noted the following in the patient's chart:   Medical and social history Use of alcohol, tobacco or illicit drugs  Current medications and supplements including opioid prescriptions. Patient is not currently taking opioid prescriptions. Functional ability and status Nutritional status Physical activity Advanced directives List of other physicians Hospitalizations, surgeries, and ER visits in previous 12 months Vitals Screenings to include cognitive, depression, and falls Referrals and appointments  In addition, I have reviewed and discussed with patient certain preventive protocols, quality metrics, and best practice recommendations. A written personalized care plan for preventive services as well as general preventive health recommendations were provided to patient.   Bruno Capri, LPN   1/61/0960   After Visit Summary: (MyChart) Due to this being a telephonic visit, the after visit summary with patients personalized plan was offered to patient via MyChart   Notes: Nothing significant to report at this time.

## 2023-12-13 NOTE — Patient Instructions (Signed)
 Tracy Briggs , Thank you for taking time out of your busy schedule to complete your Annual Wellness Visit with me. I enjoyed our conversation and look forward to speaking with you again next year. I, as well as your care team,  appreciate your ongoing commitment to your health goals. Please review the following plan we discussed and let me know if I can assist you in the future. Your Game plan/ To Do List    Referrals: If you haven't heard from the office you've been referred to, please reach out to them at the phone provided.   Follow up Visits: Next Medicare AWV with our clinical staff: 12/17/24   Have you seen your provider in the last 6 months (3 months if uncontrolled diabetes)? No Next Office Visit with your provider: pt stated she follows up with yearly physicals   Clinician Recommendations:  Aim for 30 minutes of exercise or brisk walking, 6-8 glasses of water, and 5 servings of fruits and vegetables each day.       This is a list of the screening recommended for you and due dates:  Health Maintenance  Topic Date Due   Pneumonia Vaccine (1 of 1 - PCV) Never done   Zoster (Shingles) Vaccine (1 of 2) Never done   DEXA scan (bone density measurement)  03/29/2024*   COVID-19 Vaccine (4 - 2024-25 season) 03/29/2024*   Hepatitis C Screening  03/29/2024*   Flu Shot  02/16/2024   Cologuard (Stool DNA test)  10/10/2024   Mammogram  11/16/2024   Medicare Annual Wellness Visit  12/12/2024   DTaP/Tdap/Td vaccine (2 - Td or Tdap) 03/20/2033   HPV Vaccine  Aged Out   Meningitis B Vaccine  Aged Out  *Topic was postponed. The date shown is not the original due date.    Advanced directives: (Declined) Advance directive discussed with you today. Even though you declined this today, please call our office should you change your mind, and we can give you the proper paperwork for you to fill out. Advance Care Planning is important because it:  [x]  Makes sure you receive the medical care that is  consistent with your values, goals, and preferences  [x]  It provides guidance to your family and loved ones and reduces their decisional burden about whether or not they are making the right decisions based on your wishes.  Follow the link provided in your after visit summary or read over the paperwork we have mailed to you to help you started getting your Advance Directives in place. If you need assistance in completing these, please reach out to us  so that we can help you!  See attachments for Preventive Care and Fall Prevention Tips.

## 2023-12-15 ENCOUNTER — Other Ambulatory Visit: Payer: Self-pay | Admitting: Physician Assistant

## 2023-12-15 NOTE — Telephone Encounter (Signed)
 Copied from CRM [id number]. Topic: Clinical - Medication Refill >> Dec 15, 2023 11:51 AM Adonis Hoot wrote: Medication: losartan -hydrochlorothiazide (HYZAAR) 100-12.5 MG tablet  Has the patient contacted their pharmacy? No (Agent: If no, request that the patient contact the pharmacy for the refill. If patient does not wish to contact the pharmacy document the reason why and proceed with request.) (Agent: If yes, when and what did the pharmacy advise?)  This is the patient's preferred pharmacy:  [id number] [place], [place] - [address] [place] [address] Phone: [phone] Fax: [phone]  Is this the correct pharmacy for this prescription? Yes If no, delete pharmacy and type the correct one.   Has the prescription been filled recently? no  Is the patient out of the medication? Yes  Has the patient been seen for an appointment in the last year OR does the patient have an upcoming appointment? Yes  Can we respond through MyChart? Yes  Agent: Please be advised that Rx refills may take up to 3 business days. We ask that you follow-up with your pharmacy.

## 2023-12-18 ENCOUNTER — Other Ambulatory Visit: Payer: Self-pay | Admitting: Physician Assistant

## 2023-12-18 DIAGNOSIS — Z1231 Encounter for screening mammogram for malignant neoplasm of breast: Secondary | ICD-10-CM

## 2024-02-06 ENCOUNTER — Ambulatory Visit
Admission: RE | Admit: 2024-02-06 | Discharge: 2024-02-06 | Disposition: A | Discharge status: Home / Self Care | Source: Ambulatory Visit | Attending: Physician Assistant | Admitting: Physician Assistant

## 2024-02-06 DIAGNOSIS — Z1231 Encounter for screening mammogram for malignant neoplasm of breast: Secondary | ICD-10-CM

## 2024-03-10 ENCOUNTER — Other Ambulatory Visit: Payer: Self-pay | Admitting: Physician Assistant

## 2024-04-02 ENCOUNTER — Other Ambulatory Visit: Payer: Self-pay | Admitting: Physician Assistant

## 2024-06-27 ENCOUNTER — Ambulatory Visit (INDEPENDENT_AMBULATORY_CARE_PROVIDER_SITE_OTHER): Payer: 59 | Admitting: Otolaryngology

## 2024-06-28 ENCOUNTER — Other Ambulatory Visit: Payer: Self-pay | Admitting: Physician Assistant

## 2024-08-07 ENCOUNTER — Ambulatory Visit (INDEPENDENT_AMBULATORY_CARE_PROVIDER_SITE_OTHER): Admitting: Otolaryngology

## 2024-08-27 ENCOUNTER — Ambulatory Visit (INDEPENDENT_AMBULATORY_CARE_PROVIDER_SITE_OTHER): Admitting: Otolaryngology

## 2024-12-17 ENCOUNTER — Ambulatory Visit
